# Patient Record
Sex: Male | Born: 1960 | Race: Black or African American | Hispanic: No | Marital: Married | Smoking: Never smoker
Health system: Southern US, Community
[De-identification: ages and names within clinical notes are randomized; demographics above are authoritative.]

## PROBLEM LIST (undated history)

## (undated) DIAGNOSIS — I454 Nonspecific intraventricular block: Secondary | ICD-10-CM

## (undated) DIAGNOSIS — C61 Malignant neoplasm of prostate: Secondary | ICD-10-CM

## (undated) DIAGNOSIS — R7303 Prediabetes: Secondary | ICD-10-CM

## (undated) HISTORY — DX: Malignant neoplasm of prostate: C61

## (undated) HISTORY — DX: Nonspecific intraventricular block: I45.4

## (undated) HISTORY — PX: PROSTATE BIOPSY: SHX241

---
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
## 2000-07-23 ENCOUNTER — Emergency Department (HOSPITAL_COMMUNITY): Admission: EM | Admit: 2000-07-23 | Discharge: 2000-07-23 | Payer: Self-pay | Admitting: Emergency Medicine

## 2000-07-23 ENCOUNTER — Encounter: Payer: Self-pay | Admitting: Emergency Medicine

## 2009-08-08 DIAGNOSIS — R079 Chest pain, unspecified: Secondary | ICD-10-CM | POA: Insufficient documentation

## 2009-08-08 DIAGNOSIS — I454 Nonspecific intraventricular block: Secondary | ICD-10-CM | POA: Insufficient documentation

## 2009-08-08 HISTORY — DX: Nonspecific intraventricular block: I45.4

## 2010-04-17 ENCOUNTER — Emergency Department (HOSPITAL_COMMUNITY)
Admission: EM | Admit: 2010-04-17 | Discharge: 2010-04-17 | Payer: Self-pay | Source: Home / Self Care | Admitting: Emergency Medicine

## 2010-04-25 LAB — OCCULT BLOOD, POC DEVICE: Fecal Occult Bld: NEGATIVE

## 2011-07-03 DIAGNOSIS — I454 Nonspecific intraventricular block: Secondary | ICD-10-CM

## 2011-09-19 DIAGNOSIS — C61 Malignant neoplasm of prostate: Secondary | ICD-10-CM

## 2011-09-19 HISTORY — DX: Malignant neoplasm of prostate: C61

## 2011-11-21 ENCOUNTER — Encounter: Payer: Self-pay | Admitting: *Deleted

## 2011-11-21 NOTE — Progress Notes (Signed)
Married  07/11/11   PSA 2.62 06/23/11 PSA 2.65

## 2011-11-23 ENCOUNTER — Inpatient Hospital Stay: Admission: RE | Admit: 2011-11-23 | Payer: Self-pay | Source: Ambulatory Visit

## 2011-11-23 ENCOUNTER — Encounter: Payer: Self-pay | Admitting: Radiation Oncology

## 2011-11-23 ENCOUNTER — Ambulatory Visit
Admission: RE | Admit: 2011-11-23 | Discharge: 2011-11-23 | Disposition: A | Discharge status: Home / Self Care | Payer: Managed Care, Other (non HMO) | Source: Ambulatory Visit | Attending: Radiation Oncology | Admitting: Radiation Oncology

## 2011-11-23 VITALS — BP 114/74 | HR 63 | Temp 98.8°F | Resp 20 | Ht 69.0 in | Wt 158.5 lb

## 2011-11-23 DIAGNOSIS — C61 Malignant neoplasm of prostate: Secondary | ICD-10-CM | POA: Insufficient documentation

## 2011-11-23 NOTE — Progress Notes (Signed)
Radiation Oncology         [phone]) [phone] ________________________________  Initial outpatient Consultation  Name: Adam Navarro MRN: [id number]  Date: 11/23/2011  DOB: 1961-02-27  CC:  Adam Mc, MD   REFERRING PHYSICIAN: [name], MD  DIAGNOSIS: There were no encounter diagnoses.  HISTORY OF PRESENT ILLNESS::Adam Navarro is a 51 y.o. gentleman.  He was noted to have an elevated PSA of 2.65 by his primary care physician, Dr. Isabella Navarro.  Accordingly, he was referred for evaluation in urology by Dr. Laverle Navarro on 08/23/11,  digital rectal examination was performed at that time revealing a 45 g prostate with no nodules..  The patient proceeded to transrectal ultrasound with 12 biopsies of the prostate on 09/19/2011.  The prostate volume measured 21.7 cc.  Out of 12 core biopsies, one was positive.  The maximum Gleason score was 3+3 equals 6, and this was seen in 5% of the left lateral apex.  The patient reviewed the biopsy results with his urologist and he has kindly been referred today for discussion of potential radiation treatment options. Marland Kitchen  PREVIOUS RADIATION THERAPY: No  PAST MEDICAL HISTORY:  has a past medical history of Chest pain (08/2009); Incomplete bundle branch block (08/2009); and Prostate cancer (09/19/11).    PAST SURGICAL HISTORY: Past Surgical History  Procedure Date  . Scapula 1973    R clavicular fracture    FAMILY HISTORY: family history includes Hypertension in his father and mother.  SOCIAL HISTORY:  reports that he has never smoked. He does not have any smokeless tobacco history on file. He reports that he drinks alcohol. He reports that he does not use illicit drugs.  ALLERGIES: Review of patient's allergies indicates no known allergies.  MEDICATIONS:  Current Outpatient Prescriptions  Medication Sig Dispense Refill  . Vitamin D, Ergocalciferol, (DRISDOL) 50000 UNITS CAPS Take 50,000 Units by mouth.        REVIEW OF SYSTEMS:  A 15 point review of systems  is documented in the electronic medical record. This was obtained by the nursing staff. However, I reviewed this with the patient to discuss relevant findings and make appropriate changes.  A comprehensive review of systems was negative. patient to let urinary symptom questionnaire indicating no outflow obstructive symptoms whatsoever. He also: Erectile function questionnaire indicating no erectile dysfunction   PHYSICAL EXAM:  height is 5\' 9"  (1.753 m) and weight is 158 lb 8 oz (71.895 kg). His oral temperature is 98.8 F (37.1 C). His blood pressure is 114/74 and his pulse is 63. His respiration is 20.   Adam Navarro is in no acute distress today. Is alert and oriented. Detailed physical exams deferred in the interest of patient counseling. Please note the digital rectal exam findings described by Dr. Laverle Navarro with no nodules within the prostate.    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 He falls into a favorable risk group. He is eligible for a variety of potential treatment strategies including active surveillance, radical prostatectomy, and radiation treatment either with external radiation or seed implantation.  PLAN:Today I reviewed the findings and workup thus far.  We discussed the natural history of prostate cancer.  We reviewed the the implications of T-stage, Gleason's Score, and PSA on decision-making and outcomes in prostate cancer.  We discussed radiation treatment in the management of prostate cancer with regard to the logistics and delivery of external beam radiation treatment as well as  the logistics and delivery of prostate brachytherapy.  We compared and contrasted each of these approaches and also compared these against prostatectomy.  The patient expressed interest in robotic-assisted laparoscopic radical prostatectomy. However, he is also interested in the potential for active surveillance to  delay surgery and gather additional prognostic information about the disease tempo of progression or PSA velocity. At this point, he is trying to decide whether to delay surgery or simply proceed with prostatectomy now. I tried to provide some insight into the pros and cons of each of these.  I will share my findings with Dr. Laverle Navarro.   The patient is planning to contact Dr. Laverle Navarro following today's visit to arrange a followup discussion.  I enjoyed meeting with him today, and will look forward to participating in the care of this very nice gentleman.   I spent 40 minutes minutes face to face with the patient and more than 50% of that time was spent in counseling and/or coordination of care.   ------------------------------------------------  Adam Navarro. Kathrynn Running, M.D.

## 2011-11-23 NOTE — Progress Notes (Signed)
Please see the Nurse Progress Note in the MD Initial Consult Encounter for this patient. 

## 2011-11-24 NOTE — Addendum Note (Signed)
Encounter addended by: Glennie Hawk, RN on: 11/24/2011 12:57 PM<BR>     Documentation filed: Charges VN

## 2012-01-19 ENCOUNTER — Other Ambulatory Visit (HOSPITAL_COMMUNITY): Payer: Self-pay | Admitting: Urology

## 2012-01-19 DIAGNOSIS — C61 Malignant neoplasm of prostate: Secondary | ICD-10-CM

## 2012-03-22 ENCOUNTER — Ambulatory Visit (HOSPITAL_COMMUNITY)
Admission: RE | Admit: 2012-03-22 | Discharge: 2012-03-22 | Disposition: A | Discharge status: Home / Self Care | Payer: Managed Care, Other (non HMO) | Source: Ambulatory Visit | Attending: Urology | Admitting: Urology

## 2012-03-22 DIAGNOSIS — C7951 Secondary malignant neoplasm of bone: Secondary | ICD-10-CM | POA: Insufficient documentation

## 2012-03-22 DIAGNOSIS — C61 Malignant neoplasm of prostate: Secondary | ICD-10-CM | POA: Insufficient documentation

## 2012-03-22 DIAGNOSIS — R599 Enlarged lymph nodes, unspecified: Secondary | ICD-10-CM | POA: Insufficient documentation

## 2012-03-22 LAB — POCT I-STAT, CHEM 8
BUN: 14 mg/dL (ref 6–23)
Calcium, Ion: 1.29 mmol/L — ABNORMAL HIGH (ref 1.12–1.23)
Chloride: 102 mEq/L (ref 96–112)
Creatinine, Ser: 1.1 mg/dL (ref 0.50–1.35)
Glucose, Bld: 97 mg/dL (ref 70–99)
HCT: 48 % (ref 39.0–52.0)
Hemoglobin: 16.3 g/dL (ref 13.0–17.0)
Potassium: 4.8 mEq/L (ref 3.5–5.1)
Sodium: 140 mEq/L (ref 135–145)
TCO2: 32 mmol/L (ref 0–100)

## 2012-03-22 MED ORDER — GADOBENATE DIMEGLUMINE 529 MG/ML IV SOLN
15.0000 mL | Freq: Once | INTRAVENOUS | Status: AC | PRN
  Administered 2012-03-22: 14 mL via INTRAVENOUS

## 2012-10-23 ENCOUNTER — Encounter: Payer: Self-pay | Admitting: Internal Medicine

## 2012-12-31 ENCOUNTER — Ambulatory Visit (AMBULATORY_SURGERY_CENTER): Payer: Self-pay | Admitting: *Deleted

## 2012-12-31 VITALS — Ht 70.0 in | Wt 159.4 lb

## 2012-12-31 DIAGNOSIS — Z1211 Encounter for screening for malignant neoplasm of colon: Secondary | ICD-10-CM

## 2012-12-31 MED ORDER — NA SULFATE-K SULFATE-MG SULF 17.5-3.13-1.6 GM/177ML PO SOLN: ORAL | Status: DC

## 2012-12-31 NOTE — Progress Notes (Signed)
No egg or soy allergy 

## 2013-01-03 ENCOUNTER — Encounter: Payer: Self-pay | Admitting: Internal Medicine

## 2013-01-10 ENCOUNTER — Encounter: Payer: Self-pay | Admitting: Internal Medicine

## 2013-01-10 ENCOUNTER — Ambulatory Visit (AMBULATORY_SURGERY_CENTER): Payer: Managed Care, Other (non HMO) | Admitting: Internal Medicine

## 2013-01-10 VITALS — BP 107/54 | HR 65 | Temp 97.0°F | Resp 12 | Ht 70.0 in | Wt 159.0 lb

## 2013-01-10 DIAGNOSIS — Z1211 Encounter for screening for malignant neoplasm of colon: Secondary | ICD-10-CM

## 2013-01-10 MED ORDER — SODIUM CHLORIDE 0.9 % IV SOLN: 500.0000 mL | INTRAVENOUS | Status: DC

## 2013-01-10 NOTE — Op Note (Signed)
[facility] [address] [place], [address]   COLONOSCOPY PROCEDURE REPORT  PATIENT: Adam, Navarro  MR#: [id number] BIRTHDATE: 12/08/1960 , 52  yrs. old GENDER: Male ENDOSCOPIST: Iva Boop, MD, [facility] REFERRED NG:EXBMW Richter, M.D. PROCEDURE DATE:  01/10/2013 PROCEDURE:   Colonoscopy, screening First Screening Colonoscopy - Avg.  risk and is 50 yrs.  old or older Yes.  Prior Negative Screening - Now for repeat screening. N/A  History of Adenoma - Now for follow-up colonoscopy & has been > or = to 3 yrs.  N/A  Polyps Removed Today? No.  Recommend repeat exam, <10 yrs? No. ASA CLASS:   Class II INDICATIONS:average risk screening and first colonoscopy. MEDICATIONS: Propofol (Diprivan) 280 mg IV, MAC sedation, administered by CRNA, and These medications were titrated to patient response per physician's verbal order  DESCRIPTION OF PROCEDURE:   After the risks benefits and alternatives of the procedure were thoroughly explained, informed consent was obtained.  A digital rectal exam revealed no rectal mass.   The LB [id number]  endoscope was introduced through the anus and advanced to the cecum, which was identified by both the appendix and ileocecal valve. No adverse events experienced. The quality of the prep was excellent using Suprep  The instrument was then slowly withdrawn as the colon was fully examined.      COLON FINDINGS: A normal appearing cecum, ileocecal valve, and appendiceal orifice were identified.  The ascending, hepatic flexure, transverse, splenic flexure, descending, sigmoid colon and rectum appeared unremarkable.  No polyps or cancers were seen.   A right colon retroflexion was performed.  Retroflexed views revealed internal hemorrhoids. The time to cecum=3 minutes 05 seconds. Withdrawal time=8 minutes 03 seconds.  The scope was withdrawn and the procedure completed. COMPLICATIONS: There were no complications.  ENDOSCOPIC  IMPRESSION: 1.   Normal colon - excellent prep 2.   Internal hemorrhoids in rectum  RECOMMENDATIONS: Repeat routine colonscopy in 10 years - 2024. Annual hemoccults not indicated in interim.   eSigned:  Iva Boop, MD, [facility] 01/10/2013 9:56 AM   cc: The Patient and Nadyne Coombes, MD

## 2013-01-10 NOTE — Progress Notes (Signed)
Report to pacu rn, vss, bbs=clear 

## 2013-01-10 NOTE — Progress Notes (Signed)
Patient did not have preoperative order for IV antibiotic SSI prophylaxis. (G8918)  Patient did not experience any of the following events: a burn prior to discharge; a fall within the facility; wrong site/side/patient/procedure/implant event; or a hospital transfer or hospital admission upon discharge from the facility. (G8907)  

## 2013-01-10 NOTE — Patient Instructions (Addendum)
You have some hemorrhoids but everything else is ok. No polyps or cancer.  Next routine colonoscopy in 10 years - 2024.  If you have hemorrhoid problems (swelling, itching, bleeding) I am able to treat those with an in-office procedure. If you like, please call my office at [phone] to schedule an appointment and I can evaluate you further.  I appreciate the opportunity to care for you. Iva Boop, MD, FACG  YOU HAD AN ENDOSCOPIC PROCEDURE TODAY AT THE [facility]: Refer to the procedure report that was given to you for any specific questions about what was found during the examination.  If the procedure report does not answer your questions, please call your gastroenterologist to clarify.  If you requested that your care partner not be given the details of your procedure findings, then the procedure report has been included in a sealed envelope for you to review at your convenience later.  YOU SHOULD EXPECT: Some feelings of bloating in the abdomen. Passage of more gas than usual.  Walking can help get rid of the air that was put into your GI tract during the procedure and reduce the bloating. If you had a lower endoscopy (such as a colonoscopy or flexible sigmoidoscopy) you may notice spotting of blood in your stool or on the toilet paper. If you underwent a bowel prep for your procedure, then you may not have a normal bowel movement for a few days.  DIET: Your first meal following the procedure should be a light meal and then it is ok to progress to your normal diet.  A half-sandwich or bowl of soup is an example of a good first meal.  Heavy or fried foods are harder to digest and may make you feel nauseous or bloated.  Likewise meals heavy in dairy and vegetables can cause extra gas to form and this can also increase the bloating.  Drink plenty of fluids but you should avoid alcoholic beverages for 24 hours.  ACTIVITY: Your care partner should take you home directly after the  procedure.  You should plan to take it easy, moving slowly for the rest of the day.  You can resume normal activity the day after the procedure however you should NOT DRIVE or use heavy machinery for 24 hours (because of the sedation medicines used during the test).    SYMPTOMS TO REPORT IMMEDIATELY: A gastroenterologist can be reached at any hour.  During normal business hours, 8:30 AM to 5:00 PM Monday through Friday, call [phone].  After hours and on weekends, please call the GI answering service at [phone] who will take a message and have the physician on call contact you.   Following lower endoscopy (colonoscopy or flexible sigmoidoscopy):  Excessive amounts of blood in the stool  Significant tenderness or worsening of abdominal pains  Swelling of the abdomen that is new, acute  Fever of 100F or higher  FOLLOW UP: If any biopsies were taken you will be contacted by phone or by letter within the next 1-3 weeks.  Call your gastroenterologist if you have not heard about the biopsies in 3 weeks.  Our staff will call the home number listed on your records the next business day following your procedure to check on you and address any questions or concerns that you may have at that time regarding the information given to you following your procedure. This is a courtesy call and so if there is no answer at the home number and we  have not heard from you through the emergency physician on call, we will assume that you have returned to your regular daily activities without incident.  SIGNATURES/CONFIDENTIALITY: You and/or your care partner have signed paperwork which will be entered into your electronic medical record.  These signatures attest to the fact that that the information above on your After Visit Summary has been reviewed and is understood.  Full responsibility of the confidentiality of this discharge information lies with you and/or your care-partner.

## 2013-01-13 ENCOUNTER — Telehealth: Payer: Self-pay | Admitting: *Deleted

## 2013-01-13 NOTE — Telephone Encounter (Signed)
  Follow up Call-  Call back number 01/10/2013  Post procedure Call Back phone  # [phone]- no message/ [phone]- leave message here  Permission to leave phone message No     Patient questions:  Do you have a fever, pain , or abdominal swelling? no Pain Score  0 *  Have you tolerated food without any problems? yes  Have you been able to return to your normal activities? yes  Do you have any questions about your discharge instructions: Diet   no Medications  no Follow up visit  no  Do you have questions or concerns about your Care? no  Actions: * If pain score is 4 or above: No action needed, pain <4.

## 2013-05-01 ENCOUNTER — Encounter (HOSPITAL_BASED_OUTPATIENT_CLINIC_OR_DEPARTMENT_OTHER): Payer: Self-pay | Admitting: Emergency Medicine

## 2013-05-01 ENCOUNTER — Emergency Department (HOSPITAL_BASED_OUTPATIENT_CLINIC_OR_DEPARTMENT_OTHER)
Admission: EM | Admit: 2013-05-01 | Discharge: 2013-05-01 | Disposition: A | Discharge status: Home / Self Care | Payer: Managed Care, Other (non HMO) | Attending: Emergency Medicine | Admitting: Emergency Medicine

## 2013-05-01 ENCOUNTER — Emergency Department (HOSPITAL_BASED_OUTPATIENT_CLINIC_OR_DEPARTMENT_OTHER): Payer: Managed Care, Other (non HMO)

## 2013-05-01 DIAGNOSIS — Z8546 Personal history of malignant neoplasm of prostate: Secondary | ICD-10-CM | POA: Insufficient documentation

## 2013-05-01 DIAGNOSIS — R0789 Other chest pain: Secondary | ICD-10-CM | POA: Insufficient documentation

## 2013-05-01 DIAGNOSIS — J111 Influenza due to unidentified influenza virus with other respiratory manifestations: Secondary | ICD-10-CM | POA: Insufficient documentation

## 2013-05-01 DIAGNOSIS — Z8679 Personal history of other diseases of the circulatory system: Secondary | ICD-10-CM | POA: Insufficient documentation

## 2013-05-01 DIAGNOSIS — R000 Tachycardia, unspecified: Secondary | ICD-10-CM | POA: Insufficient documentation

## 2013-05-01 MED ORDER — HYDROCODONE-HOMATROPINE 5-1.5 MG/5ML PO SYRP: 5.0000 mL | ORAL_SOLUTION | Freq: Four times a day (QID) | ORAL | Status: DC | PRN

## 2013-05-01 NOTE — ED Notes (Signed)
Fever, SOB, body aches, cough, joint aches x4 days.

## 2013-05-01 NOTE — ED Provider Notes (Signed)
CSN: [id number]     Arrival date & time 05/01/13  1704 History  This chart was scribed for Blanchie Dessert, MD by Zettie Pho, ED Scribe. This patient was seen in room MH06/MH06 and the patient's care was started at [place] PM.    Chief Complaint  Patient presents with  . Shortness of Breath   The history is provided by the patient. No language interpreter was used.   HPI Comments: Adam Navarro is a 53 y.o. male who presents to the Emergency Department complaining of fever (101.4 measured in the ED) with associated dry cough, mild sore throat, rhinorrhea, diffuse myalgias and arthralgias onset 4-5 days ago. He reports some associated shortness of breath and chest pain that is exacerbated with coughing. He denies emesis, diarrhea, wheezes. He states that he did not receive a flu vaccination this year. Patient denies any known sick contacts, but does work at [employer]. He denies any allergies to medications. He denies history of DM or asthma. Patient has a history of incomplete bundle branch block and prostate cancer.   Past Medical History  Diagnosis Date  . Chest pain 08/2009  . Incomplete bundle branch block 08/2009  . Prostate cancer 09/19/11    Gleason 3+3=6, volume21.7 cc   Past Surgical History  Procedure Laterality Date  . Scapula  1973    R clavicular fracture   Family History  Problem Relation Age of Onset  . Hypertension Mother   . Hypertension Father   . Colon cancer Neg Hx   . Esophageal cancer Neg Hx   . Stomach cancer Neg Hx   . Rectal cancer Neg Hx    History  Substance Use Topics  . Smoking status: Never Smoker   . Smokeless tobacco: Never Used  . Alcohol Use: 0 - .5 oz/week    0-1 drink(s) per week     Comment: Pt states he drinks occasionally (beer and vodka)    Review of Systems  A complete 10 system review of systems was obtained and all systems are negative except as noted in the HPI and PMH.   Allergies  Review of patient's allergies indicates no known  allergies.  Home Medications   Current Outpatient Rx  Name  Route  Sig  Dispense  Refill  . Vitamin D, Ergocalciferol, (DRISDOL) 50000 UNITS CAPS   Oral   Take 50,000 Units by mouth.          Triage Vitals: BP 133/88  Pulse 108  Temp(Src) 101.4 F (38.6 C) (Oral)  Resp 18  Ht 5\' 9"  (1.753 m)  Wt 160 lb (72.576 kg)  BMI 23.62 kg/m2  SpO2 100%  Physical Exam  Nursing note and vitals reviewed. Constitutional: He is oriented to person, place, and time. He appears well-developed and well-nourished. No distress.  HENT:  Head: Normocephalic and atraumatic.  Right Ear: Hearing, tympanic membrane, external ear and ear canal normal.  Left Ear: Hearing, tympanic membrane, external ear and ear canal normal.  Mouth/Throat: No oropharyngeal exudate.  Mild pharyngeal erythema.   Eyes: Conjunctivae are normal.  Neck: Normal range of motion. Neck supple.  Cardiovascular: Regular rhythm and normal heart sounds.   Tachycardic.   Pulmonary/Chest: Effort normal and breath sounds normal. No respiratory distress.  Abdominal: Soft. He exhibits no distension. There is no tenderness.  Musculoskeletal: Normal range of motion.  Lymphadenopathy:    He has no cervical adenopathy.  Neurological: He is alert and oriented to person, place, and time.  Skin: Skin is warm  and dry.  Skin is hot to the touch.   Psychiatric: He has a normal mood and affect. His behavior is normal.    ED Course  Procedures (including critical care time)  DIAGNOSTIC STUDIES: Oxygen Saturation is 100% on room air, normal by my interpretation.    COORDINATION OF CARE: 6:20 PM- Discussed that chest x-ray results were negative and that symptoms are likely due to influenza. Discussed that Tamiflu will not be effective at this stage of the illness. Will discharge patient with Hycodan to manage symptoms. Advised patient to stay hydrated and alternate Tylenol and ibuprofen at home. Discussed treatment plan with patient at  bedside and patient verbalized agreement.    Labs Review Labs Reviewed - No data to display  Imaging Review Dg Chest 2 View  05/01/2013   CLINICAL DATA:  Cough and fever.  History of prostate cancer.  EXAM: CHEST  2 VIEW  COMPARISON:  None.  FINDINGS: Mild pectus excavatum deformity. Surgical changes of the distal right clavicle. Midline trachea. Normal heart size and mediastinal contours. No pleural effusion or pneumothorax. Clear lungs.  IMPRESSION: No acute cardiopulmonary disease.   Electronically Signed   By: Abigail Miyamoto M.D.   On: 05/01/2013 18:15    EKG Interpretation   None       MDM   1. Influenza     Pt with symptoms consistent with influenza.  Normal exam here but is febrile.  No signs of breathing difficulty  No signs of strep pharyngitis, otitis or abnormal abdominal findings.   CXR wnl.  Will continue antipyretica and rest and fluids and return for any further problems.   I personally performed the services described in this documentation, which was scribed in my presence.  The recorded information has been reviewed and considered.     Blanchie Dessert, MD 05/01/13 [phone]

## 2016-01-07 ENCOUNTER — Encounter: Payer: Self-pay | Admitting: Medical Oncology

## 2016-01-11 ENCOUNTER — Telehealth: Payer: Self-pay | Admitting: Medical Oncology

## 2016-01-11 NOTE — Telephone Encounter (Signed)
Left message requesting a return call regarding referral to Prostate [facility]. 

## 2016-01-18 ENCOUNTER — Encounter: Payer: Self-pay | Admitting: Medical Oncology

## 2016-01-20 ENCOUNTER — Encounter: Payer: Self-pay | Admitting: Radiation Oncology

## 2016-01-20 ENCOUNTER — Telehealth: Payer: Self-pay | Admitting: Medical Oncology

## 2016-01-20 NOTE — Progress Notes (Signed)
I called pt to introduce myself as the Prostate Nurse Navigator and the Coordinator of the Prostate [place].  1. I confirmed with the patient he is aware of his referral to the clinic 01/21/16 arriving at 7:45am.  2. I discussed the format of the clinic and the physicians he will be seeing that day.  3. I discussed where the clinic is located and how to contact me.  4. I confirmed his address and informed him I would be mailing a packet of information and forms to be completed. I asked him to bring them with him the day of his appointment.   He voiced understanding of the above. I asked him to call me if he has any questions or concerns regarding his appointments or the forms he needs to complete.

## 2016-01-20 NOTE — Telephone Encounter (Signed)
Left a reminder message  For Prostate Adam Navarro - [facility] 01/21/16 arriving at 7:45am. I reminded him to bring his completed medical forms and how to register. I asked him to call me back with questions or concerns.

## 2016-01-20 NOTE — Progress Notes (Signed)
GU Location of Tumor / Histology: prostatic adenocarcinoma  If Prostate Cancer, Gleason Score is (3 + 4) and PSA is (2.59) January 2017  [place] was found to have an elevated PSA of 2.6 in 2013. Biopsy confirmed Gleason 3+3. Patient met with Dr. Tammi Klippel but, opted for active surveillance. Repeat biopsy done August 2017 revealed progression. Patient has expressed that he doesn't want surgery but, instead leaning toward radiation therapy.  Biopsies of prostate (if applicable) revealed:    Past/Anticipated interventions by urology, if any: active surveillance  Past/Anticipated interventions by medical oncology, if any: no  Weight changes, if any: no  Bowel/Bladder complaints, if any: no   Nausea/Vomiting, if any: no  Pain issues, if any:  no  SAFETY ISSUES:  Prior radiation? no  Pacemaker/ICD? no  Possible current pregnancy? no  Is the patient on methotrexate? no  Current Complaints / other details:  55 year old male. Married.

## 2016-01-21 ENCOUNTER — Ambulatory Visit
Admission: RE | Admit: 2016-01-21 | Discharge: 2016-01-21 | Disposition: A | Discharge status: Home / Self Care | Payer: Managed Care, Other (non HMO) | Source: Ambulatory Visit | Attending: Radiation Oncology | Admitting: Radiation Oncology

## 2016-01-21 ENCOUNTER — Ambulatory Visit: Payer: Managed Care, Other (non HMO) | Admitting: Oncology

## 2016-01-25 ENCOUNTER — Telehealth: Payer: Self-pay | Admitting: Medical Oncology

## 2016-01-25 NOTE — Telephone Encounter (Signed)
Left message requesting a return call to discuss missed appointment for Prostate [facility]  01/21/16

## 2016-01-26 ENCOUNTER — Telehealth: Payer: Self-pay | Admitting: Medical Oncology

## 2016-01-26 NOTE — Telephone Encounter (Signed)
Left message requesting return call regarding missed appointment for Prostate [facility].

## 2016-01-28 ENCOUNTER — Encounter: Payer: Self-pay | Admitting: Medical Oncology

## 2016-01-28 NOTE — Progress Notes (Signed)
I notified [facility] that I have left several messages with Adam Navarro regarding his missed Prostate [place] appointment without a return call. Bethena Roys- medical records sent me a note that she received a request from [place] for records for a second opinion.

## 2017-03-06 ENCOUNTER — Other Ambulatory Visit: Payer: Self-pay | Admitting: Urology

## 2017-05-08 NOTE — Patient Instructions (Signed)
Adam Navarro  05/08/2017   Your procedure is scheduled on: 05-14-17    Report to [facility] Main  Entrance   Follow signs to Short Stay on first floor at 530 AM    Call this number if you have problems the morning of surgery [phone]     Remember: Do not eat food or drink liquids :After Midnight.     Take these medicines the morning of surgery with A SIP OF WATER: NONE                                You may not have any metal on your body including hair pins and              piercings  Do not wear jewelry, make-up, lotions, powders or perfumes, deodorant              Men may shave face and neck.   Do not bring valuables to the hospital. [place].  Contacts, dentures or bridgework may not be worn into surgery.  Leave suitcase in the car. After surgery it may be brought to your room.                 Please read over the following fact sheets you were given: _____________________________________________________________________             [facility] - Preparing for Surgery Before surgery, you can play an important role.  Because skin is not sterile, your skin needs to be as free of germs as possible.  You can reduce the number of germs on your skin by washing with CHG (chlorahexidine gluconate) soap before surgery.  CHG is an antiseptic cleaner which kills germs and bonds with the skin to continue killing germs even after washing. Please DO NOT use if you have an allergy to CHG or antibacterial soaps.  If your skin becomes reddened/irritated stop using the CHG and inform your nurse when you arrive at Short Stay. Do not shave (including legs and underarms) for at least 48 hours prior to the first CHG shower.  You may shave your face/neck. Please follow these instructions carefully:  1.  Shower with CHG Soap the night before surgery and the  morning of Surgery.  2.  If you choose to wash your hair,  wash your hair first as usual with your  normal  shampoo.  3.  After you shampoo, rinse your hair and body thoroughly to remove the  shampoo.                           4.  Use CHG as you would any other liquid soap.  You can apply chg directly  to the skin and wash                       Gently with a scrungie or clean washcloth.  5.  Apply the CHG Soap to your body ONLY FROM THE NECK DOWN.   Do not use on face/ open                           Wound  or open sores. Avoid contact with eyes, ears mouth and genitals (private parts).                       Wash face,  Genitals (private parts) with your normal soap.             6.  Wash thoroughly, paying special attention to the area where your surgery  will be performed.  7.  Thoroughly rinse your body with warm water from the neck down.  8.  DO NOT shower/wash with your normal soap after using and rinsing off  the CHG Soap.                9.  Pat yourself dry with a clean towel.            10.  Wear clean pajamas.            11.  Place clean sheets on your bed the night of your first shower and do not  sleep with pets. Day of Surgery : Do not apply any lotions/deodorants the morning of surgery.  Please wear clean clothes to the hospital/surgery center.  FAILURE TO FOLLOW THESE INSTRUCTIONS MAY RESULT IN THE CANCELLATION OF YOUR SURGERY PATIENT SIGNATURE_________________________________  NURSE SIGNATURE__________________________________  ________________________________________________________________________   Adam Phenix  An incentive spirometer is a tool that can help keep your lungs clear and active. This tool measures how well you are filling your lungs with each breath. Taking long deep breaths may help reverse or decrease the chance of developing breathing (pulmonary) problems (especially infection) following:  A long period of time when you are unable to move or be active. BEFORE THE PROCEDURE   If the spirometer includes an  indicator to show your best effort, your nurse or respiratory therapist will set it to a desired goal.  If possible, sit up straight or lean slightly forward. Try not to slouch.  Hold the incentive spirometer in an upright position. INSTRUCTIONS FOR USE  1. Sit on the edge of your bed if possible, or sit up as far as you can in bed or on a chair. 2. Hold the incentive spirometer in an upright position. 3. Breathe out normally. 4. Place the mouthpiece in your mouth and seal your lips tightly around it. 5. Breathe in slowly and as deeply as possible, raising the piston or the ball toward the top of the column. 6. Hold your breath for 3-5 seconds or for as long as possible. Allow the piston or ball to fall to the bottom of the column. 7. Remove the mouthpiece from your mouth and breathe out normally. 8. Rest for a few seconds and repeat Steps 1 through 7 at least 10 times every 1-2 hours when you are awake. Take your time and take a few normal breaths between deep breaths. 9. The spirometer may include an indicator to show your best effort. Use the indicator as a goal to work toward during each repetition. 10. After each set of 10 deep breaths, practice coughing to be sure your lungs are clear. If you have an incision (the cut made at the time of surgery), support your incision when coughing by placing a pillow or rolled up towels firmly against it. Once you are able to get out of bed, walk around indoors and cough well. You may stop using the incentive spirometer when instructed by your caregiver.  RISKS AND COMPLICATIONS  Take your time so you do not get dizzy  or light-headed.  If you are in pain, you may need to take or ask for pain medication before doing incentive spirometry. It is harder to take a deep breath if you are having pain. AFTER USE  Rest and breathe slowly and easily.  It can be helpful to keep track of a log of your progress. Your caregiver can provide you with a simple table  to help with this. If you are using the spirometer at home, follow these instructions: [place] IF:   You are having difficultly using the spirometer.  You have trouble using the spirometer as often as instructed.  Your pain medication is not giving enough relief while using the spirometer.  You develop fever of 100.5 F (38.1 C) or higher. SEEK IMMEDIATE MEDICAL CARE IF:   You cough up bloody sputum that had not been present before.  You develop fever of 102 F (38.9 C) or greater.  You develop worsening pain at or near the incision site. MAKE SURE YOU:   Understand these instructions.  Will watch your condition.  Will get help right away if you are not doing well or get worse. Document Released: 08/07/2006 Document Revised: 06/19/2011 Document Reviewed: 10/08/2006 ExitCare Patient Information 2014 ExitCare, [place].   ________________________________________________________________________  WHAT IS A BLOOD TRANSFUSION? Blood Transfusion Information  A transfusion is the replacement of blood or some of its parts. Blood is made up of multiple cells which provide different functions.  Red blood cells carry oxygen and are used for blood loss replacement.  White blood cells fight against infection.  Platelets control bleeding.  Plasma helps clot blood.  Other blood products are available for specialized needs, such as hemophilia or other clotting disorders. BEFORE THE TRANSFUSION  Who gives blood for transfusions?   Healthy volunteers who are fully evaluated to make sure their blood is safe. This is blood bank blood. Transfusion therapy is the safest it has ever been in the practice of medicine. Before blood is taken from a donor, a complete history is taken to make sure that person has no history of diseases nor engages in risky social behavior (examples are intravenous drug use or sexual activity with multiple partners). The donor's travel history is screened  to minimize risk of transmitting infections, such as malaria. The donated blood is tested for signs of infectious diseases, such as HIV and hepatitis. The blood is then tested to be sure it is compatible with you in order to minimize the chance of a transfusion reaction. If you or a relative donates blood, this is often done in anticipation of surgery and is not appropriate for emergency situations. It takes many days to process the donated blood. RISKS AND COMPLICATIONS Although transfusion therapy is very safe and saves many lives, the main dangers of transfusion include:   Getting an infectious disease.  Developing a transfusion reaction. This is an allergic reaction to something in the blood you were given. Every precaution is taken to prevent this. The decision to have a blood transfusion has been considered carefully by your caregiver before blood is given. Blood is not given unless the benefits outweigh the risks. AFTER THE TRANSFUSION  Right after receiving a blood transfusion, you will usually feel much better and more energetic. This is especially true if your red blood cells have gotten low (anemic). The transfusion raises the level of the red blood cells which carry oxygen, and this usually causes an energy increase.  The nurse administering the transfusion will monitor  you carefully for complications. HOME CARE INSTRUCTIONS  No special instructions are needed after a transfusion. You may find your energy is better. Speak with your caregiver about any limitations on activity for underlying diseases you may have. SEEK MEDICAL CARE IF:   Your condition is not improving after your transfusion.  You develop redness or irritation at the intravenous (IV) site. SEEK IMMEDIATE MEDICAL CARE IF:  Any of the following symptoms occur over the next 12 hours:  Shaking chills.  You have a temperature by mouth above 102 F (38.9 C), not controlled by medicine.  Chest, back, or muscle  pain.  People around you feel you are not acting correctly or are confused.  Shortness of breath or difficulty breathing.  Dizziness and fainting.  You get a rash or develop hives.  You have a decrease in urine output.  Your urine turns a dark color or changes to pink, red, or brown. Any of the following symptoms occur over the next 10 days:  You have a temperature by mouth above 102 F (38.9 C), not controlled by medicine.  Shortness of breath.  Weakness after normal activity.  The white part of the eye turns yellow (jaundice).  You have a decrease in the amount of urine or are urinating less often.  Your urine turns a dark color or changes to pink, red, or brown. Document Released: 03/24/2000 Document Revised: 06/19/2011 Document Reviewed: 11/11/2007 [facility] Patient Information 2014 [place], [place].  _______________________________________________________________________

## 2017-05-09 ENCOUNTER — Encounter (HOSPITAL_COMMUNITY): Payer: Self-pay

## 2017-05-09 ENCOUNTER — Encounter (INDEPENDENT_AMBULATORY_CARE_PROVIDER_SITE_OTHER): Payer: Self-pay

## 2017-05-09 ENCOUNTER — Other Ambulatory Visit: Payer: Self-pay

## 2017-05-09 ENCOUNTER — Encounter (HOSPITAL_COMMUNITY)
Admission: RE | Admit: 2017-05-09 | Discharge: 2017-05-09 | Disposition: A | Discharge status: Home / Self Care | Payer: Managed Care, Other (non HMO) | Source: Ambulatory Visit | Attending: Urology | Admitting: Urology

## 2017-05-09 DIAGNOSIS — Z01812 Encounter for preprocedural laboratory examination: Secondary | ICD-10-CM | POA: Insufficient documentation

## 2017-05-09 DIAGNOSIS — I10 Essential (primary) hypertension: Secondary | ICD-10-CM | POA: Insufficient documentation

## 2017-05-09 DIAGNOSIS — Z01810 Encounter for preprocedural cardiovascular examination: Secondary | ICD-10-CM | POA: Insufficient documentation

## 2017-05-09 DIAGNOSIS — I454 Nonspecific intraventricular block: Secondary | ICD-10-CM | POA: Diagnosis not present

## 2017-05-09 HISTORY — DX: Prediabetes: R73.03

## 2017-05-09 LAB — BASIC METABOLIC PANEL
Anion gap: 5 (ref 5–15)
BUN: 16 mg/dL (ref 6–20)
CO2: 29 mmol/L (ref 22–32)
Calcium: 9.6 mg/dL (ref 8.9–10.3)
Chloride: 105 mmol/L (ref 101–111)
Creatinine, Ser: 1 mg/dL (ref 0.61–1.24)
GFR calc Af Amer: >60 mL/min (ref >60)
GLUCOSE: 101 mg/dL — AB (ref 65–99)
POTASSIUM: 5.3 mmol/L — AB (ref 3.5–5.1)
Sodium: 139 mmol/L (ref 135–145)

## 2017-05-09 LAB — CBC
HEMATOCRIT: 41.5 % (ref 39.0–52.0)
Hemoglobin: 13.6 g/dL (ref 13.0–17.0)
MCH: 27.3 pg (ref 26.0–34.0)
MCHC: 32.8 g/dL (ref 30.0–36.0)
MCV: 83.2 fL (ref 78.0–100.0)
Platelets: 295 10*3/uL (ref 150–400)
RBC: 4.99 MIL/uL (ref 4.22–5.81)
RDW: 14.5 % (ref 11.5–15.5)
WBC: 4.5 10*3/uL (ref 4.0–10.5)

## 2017-05-09 LAB — HEMOGLOBIN A1C
Hgb A1c MFr Bld: 6.1 % — ABNORMAL HIGH (ref 4.8–5.6)
MEAN PLASMA GLUCOSE: 128.37 mg/dL

## 2017-05-09 LAB — ABO/RH: ABO/RH(D): O NEG

## 2017-05-11 NOTE — H&P (Signed)
Office Visit Report     04/27/2017   --------------------------------------------------------------------------------   Adam Navarro  MRN: [id number]  PRIMARY CARE:  Adam Landsman, MD  DOB: 09-26-1960, 57 year old Male  REFERRING:    SSN:   PROVIDER:  Raynelle Navarro, M.D.    SUPERVISING:  Adam Navarro, M.D.    TREATING:  Adam Navarro    LOCATION:  [facility] [phone]   --------------------------------------------------------------------------------   CC/HPI: Per OV with Dr. 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 At his last visit, we discussed his options further and he has made a treatment decision to proceed with surgical treatment. He follows up today for further discussion in preparation for surgery which is scheduled for late January. His history is outlined below.   Prostate cancer: He was found to have a PSA of 2.6 prompting a prostate biopsy on 09/19/11 which confirmed Gleason 3+3=6 adenocarcinoma of the prostate in 1 out of 12 biopsy cores. He has no family history of prostate cancer. He is very healthy and has no significant medical comorbid conditions. After discussing management options in detail with me and Dr. Tammi Navarro, he elected to proceed with active surveillance with delayed treatment of curative intent in the future. He places a high priority on his quality of life and wishes to delay definitive treatment as long as possible. He has a clear understanding of the risk of cancer progression while undergoing surveillance and has accepted that risk.   He was followed on active surveillance from June 2013 through August 2017 when he was noted to have upgraded Gleason 3+4=7 adenocarcinoma on surveillance biopsy at age 34. He was scheduled to follow up in the multidisciplinary clinic in the fall of  2017 but did not show for that appointment and apparently sought a 2nd opinion at [facility]. He again established care with me in October 2018 after receiving care and further evaluation at [facility]. He had a repeat MRI of the prostate demonstrating stability of his findings on his prior MRI. He also had an MR/US fusion biopsy that indicated Gleason 3+3=6 disease. After reviewing his options, he ultimately wished to come back to [facility] and proceed with definitive curative therapy.   Initial diagnosis: June 2013  TNM stage: cT1c Nx Mx  PSA: 2.6  Gleason score: 3+3=6  Biopsy (09/19/11): 1 /12 cores positive -- L lateral apex (5%)  Prostate volume: 21.7cc   Surveillance  Dec 2013: MRI - primary focus in right lateral mid gland and smaller focus in left lateral mid gland  Dec 2013: 26 core biopsy -- 3/26 cores positive - L lateral apex (5%, 3+3=6), R mid (2/2 cores, 10%, 10%, 3+3=6), Vol 26.9 cc, hypoechoic lesion at left apex  May 2015: 12 core biopsy - R mid (10%, 3+3=6), Vol 24.2 cc  Aug 2017: 12 core biopsy - 1/12 cores positive - R mid (10%, 3+4=7)   Baseline urinary function: He denies LUTS. IPSS is 1.  Baseline erectile function: He denies erectile dysfunction. SHIM score is 25.   118/19: He returns today for preoperative appointment. He is scheduled for Robotic Radical prostatectomy on 2/4. He denies any current dysuria, gross hematuria, or recent fevers. He denies any significant changes in his overall health since being seen last. He is not on any blood thinners.     ALLERGIES: No Allergies  MEDICATIONS: None   GU PSH: Prostate Needle Biopsy - 11/26/2015      PSH Notes: Closed Treatment Of Clavicular Fracture On The Right   NON-GU PSH: Surgical Pathology, Gross And Microscopic Examination For Prostate Needle - 11/26/2015 Treat Clavicle Fracture - 2013    GU PMH: Prostate Cancer, Prostate cancer - 2017      PMH Notes:   1) Prostate cancer: He was found to have a PSA of 2.6  prompting a prostate biopsy on 09/19/11 which confirmed Gleason 3+3=6 adenocarcinoma of the prostate in 1 out of 12 biopsy cores. He has no family history of prostate cancer. He is very healthy and has no significant medical comorbid conditions. After discussing management options in detail with me and Dr. Tammi Navarro, he elected to proceed with active surveillance with delayed treatment of curative intent in the future. He places a high priority on his quality of life and wishes to delay definitive treatment as long as possible. He has a clear understanding of the risk of cancer progression while undergoing surveillance and has accepted that risk.   He was followed on active surveillance from June 2013 through August 2017 when he was noted to have upgraded Gleason 3+4=7 adenocarcinoma on surveillance biopsy at age 54. He was scheduled to follow up in the multidisciplinary clinic in the fall of 2017 but did not show for that appointment and apparently sought a 2nd opinion at [facility]. He again established care with me in October 2018 after receiving care and further evaluation at [facility]. He had a repeat MRI of the prostate demonstrating stability of his findings on his prior MRI. He also had an MR/US fusion biopsy that indicated Gleason 3+3=6 disease. After reviewing his options, he ultimately wished to come back to [facility] and proceed with definitive curative therapy.   Initial diagnosis: June 2013  TNM stage: cT1c Nx Mx  PSA: 2.6  Gleason score: 3+3=6  Biopsy (09/19/11): 1 /12 cores positive -- L lateral apex (5%)  Prostate volume: 21.7cc   Surveillance  Dec 2013: MRI - primary focus in right lateral mid gland and smaller focus in left lateral mid gland  Dec 2013: 26 core biopsy -- 3/26 cores positive - L lateral apex (5%, 3+3=6), R mid (2/2 cores, 10%, 10%, 3+3=6), Vol 26.9 cc, hypoechoic lesion at left apex  May 2015: 12 core biopsy - R mid (10%, 3+3=6), Vol 24.2 cc  Aug 2017: 12 core biopsy - 1/12 cores  positive - R mid (10%, 3+4=7)   Baseline urinary function: He denies LUTS. IPSS is 1.  Baseline erectile function: He denies erectile dysfunction. SHIM score is 25.     NON-GU PMH: Muscle weakness (generalized) - 02/20/2017 Other specified disorders of muscle - 02/20/2017    FAMILY HISTORY: Hypertension - Runs In Family No pertinent family history - Other   SOCIAL HISTORY: Marital Status: Married Preferred Language: English; Ethnicity: Not Hispanic Or Latino; Race: Black or African American Current Smoking Status: Patient has never smoked.  Light Drinker.  Drinks 1 caffeinated drink per day.     Notes: Never A Smoker, Marital History - Currently Married, Alcohol Use   REVIEW OF SYSTEMS:    GU Review Male:   Patient denies frequent urination, hard to postpone urination, burning/ pain with urination, get up at night to urinate, leakage of urine, stream starts and stops, trouble starting your stream, have to strain to urinate , erection problems, and penile pain.  Gastrointestinal (Upper):   Patient denies nausea, vomiting, and  indigestion/ heartburn.  Gastrointestinal (Lower):   Patient denies diarrhea and constipation.  Constitutional:   Patient denies fever, night sweats, weight loss, and fatigue.  Skin:   Patient denies skin rash/ lesion and itching.  Eyes:   Patient denies blurred vision and double vision.  Ears/ Nose/ Throat:   Patient denies sore throat and sinus problems.  Hematologic/Lymphatic:   Patient denies swollen glands and easy bruising.  Cardiovascular:   Patient denies chest pains and leg swelling.  Respiratory:   Patient denies cough and shortness of breath.  Endocrine:   Patient denies excessive thirst.  Musculoskeletal:   Patient denies back pain and joint pain.  Neurological:   Patient denies headaches and dizziness.  Psychologic:   Patient denies depression and anxiety.   VITAL SIGNS:      04/27/2017 11:23 AM  Weight 158 lb / 71.67 kg  Height 69 in / 175.26  cm  BP 111/74 mmHg  Pulse 71 /min  Temperature 98.0 F / 36.6 C  BMI 23.3 kg/m   MULTI-SYSTEM PHYSICAL EXAMINATION:    Constitutional: Well-nourished. No physical deformities. Normally developed. Good grooming.  Respiratory: Normal breath sounds. No labored breathing, no use of accessory muscles.   Cardiovascular: Regular rate and rhythm. No murmur, no gallop. Normal temperature, no swelling, no varicosities.   Skin: No paleness, no jaundice, no cyanosis. No lesion, no ulcer, no rash.  Neurologic / Psychiatric: Oriented to time, oriented to place, oriented to person. No depression, no anxiety, no agitation.  Gastrointestinal: No mass, no tenderness, no rigidity, non obese abdomen.  Musculoskeletal: Spine, ribs, pelvis no bilateral tenderness. Normal gait and station of head and neck.     PAST DATA REVIEWED:  Source Of History:  Patient  Lab Test Review:   PSA  Records Review:   Pathology Reports, Previous Patient Records  Urine Test Review:   Urinalysis   02/07/17 04/24/15 10/02/14 03/27/14 06/13/13 11/02/12 03/23/12  PSA  Total PSA 3.43 ng/mL 2.59  2.66  3.12  2.50  2.51  2.85     PROCEDURES:          Urinalysis Dipstick Dipstick Cont'd  Color: Amber Bilirubin: Neg  Appearance: Clear Ketones: Neg  Specific Gravity: 1.020 Blood: Neg  pH: 7.0 Protein: Trace  Glucose: Neg Urobilinogen: 0.2    Nitrites: Neg    Leukocyte Esterase: Neg    ASSESSMENT:      ICD-10 Details  1 GU:   Prostate Cancer - C61    PLAN:           Document Letter(s):  Created for Patient: Clinical Summary         Notes:   Urinalysis today is clear and he denies any changes in his overall health. We again discussed his upcoming procedure and I answered all of his questions to the best of my ability. He will follow up accordingly for his procedure.     * Signed by Adam Navarro on 04/30/17 at 1:06 PM (EST)*

## 2017-05-13 NOTE — Anesthesia Preprocedure Evaluation (Signed)
Anesthesia Evaluation  Patient identified by MRN, date of birth, ID band Patient awake    Reviewed: Allergy & Precautions, H&P , Patient's Chart, lab work & pertinent test results, reviewed documented beta blocker date and time   Airway Mallampati: II  TM Distance: >3 FB Neck ROM: full    Dental no notable dental hx.    Pulmonary    Pulmonary exam normal breath sounds clear to auscultation       Cardiovascular  Rhythm:regular Rate:Normal     Neuro/Psych    GI/Hepatic   Endo/Other    Renal/GU      Musculoskeletal   Abdominal   Peds  Hematology   Anesthesia Other Findings   Reproductive/Obstetrics                             Anesthesia Physical Anesthesia Plan  ASA: II  Anesthesia Plan: General   Post-op Pain Management:    Induction: Intravenous  PONV Risk Score and Plan: 2 and Dexamethasone, Ondansetron and Treatment may vary due to age or medical condition  Airway Management Planned: Oral ETT  Additional Equipment:   Intra-op Plan:   Post-operative Plan: Extubation in OR  Informed Consent: I have reviewed the patients History and Physical, chart, labs and discussed the procedure including the risks, benefits and alternatives for the proposed anesthesia with the patient or authorized representative who has indicated his/her understanding and acceptance.   Dental Advisory Given  Plan Discussed with: CRNA and Surgeon  Anesthesia Plan Comments: (  )        Anesthesia Quick Evaluation

## 2017-05-14 ENCOUNTER — Ambulatory Visit (HOSPITAL_COMMUNITY): Payer: Managed Care, Other (non HMO) | Admitting: Anesthesiology

## 2017-05-14 ENCOUNTER — Encounter (HOSPITAL_COMMUNITY): Payer: Self-pay | Admitting: *Deleted

## 2017-05-14 ENCOUNTER — Encounter (HOSPITAL_COMMUNITY)
Admission: RE | Disposition: A | Discharge status: Home / Self Care | Payer: Self-pay | Source: Ambulatory Visit | Attending: Urology

## 2017-05-14 ENCOUNTER — Observation Stay (HOSPITAL_COMMUNITY)
Admission: RE | Admit: 2017-05-14 | Discharge: 2017-05-15 | Disposition: A | Discharge status: Home / Self Care | Payer: Managed Care, Other (non HMO) | Source: Ambulatory Visit | Attending: Urology | Admitting: Urology

## 2017-05-14 ENCOUNTER — Other Ambulatory Visit: Payer: Self-pay

## 2017-05-14 DIAGNOSIS — Z881 Allergy status to other antibiotic agents status: Secondary | ICD-10-CM | POA: Diagnosis not present

## 2017-05-14 DIAGNOSIS — Z8249 Family history of ischemic heart disease and other diseases of the circulatory system: Secondary | ICD-10-CM | POA: Insufficient documentation

## 2017-05-14 DIAGNOSIS — C61 Malignant neoplasm of prostate: Principal | ICD-10-CM | POA: Insufficient documentation

## 2017-05-14 HISTORY — PX: ROBOT ASSISTED LAPAROSCOPIC RADICAL PROSTATECTOMY: SHX5141

## 2017-05-14 HISTORY — PX: LYMPHADENECTOMY: SHX5960

## 2017-05-14 LAB — BASIC METABOLIC PANEL
ANION GAP: 8 (ref 5–15)
BUN: 14 mg/dL (ref 6–20)
CO2: 26 mmol/L (ref 22–32)
Calcium: 8.4 mg/dL — ABNORMAL LOW (ref 8.9–10.3)
Chloride: 106 mmol/L (ref 101–111)
Creatinine, Ser: 1.02 mg/dL (ref 0.61–1.24)
Glucose, Bld: 128 mg/dL — ABNORMAL HIGH (ref 65–99)
Potassium: 4.2 mmol/L (ref 3.5–5.1)
SODIUM: 140 mmol/L (ref 135–145)

## 2017-05-14 LAB — TYPE AND SCREEN
ABO/RH(D): O NEG
ANTIBODY SCREEN: NEGATIVE

## 2017-05-14 LAB — HEMOGLOBIN AND HEMATOCRIT, BLOOD
HEMATOCRIT: 36.6 % — AB (ref 39.0–52.0)
HEMOGLOBIN: 11.8 g/dL — AB (ref 13.0–17.0)

## 2017-05-14 SURGERY — XI ROBOTIC ASSISTED LAPAROSCOPIC RADICAL PROSTATECTOMY LEVEL 2
Anesthesia: General

## 2017-05-14 MED ORDER — DIPHENHYDRAMINE HCL 50 MG/ML IJ SOLN: 12.5000 mg | Freq: Four times a day (QID) | INTRAMUSCULAR | Status: DC | PRN

## 2017-05-14 MED ORDER — KCL IN DEXTROSE-NACL 20-5-0.45 MEQ/L-%-% IV SOLN
INTRAVENOUS | Status: DC
  Administered 2017-05-14: 18:00:00 via INTRAVENOUS
  Filled 2017-05-14 (×3): qty 1000

## 2017-05-14 MED ORDER — ROCURONIUM BROMIDE 10 MG/ML (PF) SYRINGE
PREFILLED_SYRINGE | INTRAVENOUS | Status: DC | PRN
  Administered 2017-05-14 (×2): 10 mg via INTRAVENOUS
  Administered 2017-05-14: 50 mg via INTRAVENOUS

## 2017-05-14 MED ORDER — HYDROCODONE-ACETAMINOPHEN 5-325 MG PO TABS: 1.0000 | ORAL_TABLET | Freq: Four times a day (QID) | ORAL | 0 refills | Status: DC | PRN

## 2017-05-14 MED ORDER — PROMETHAZINE HCL 25 MG/ML IJ SOLN
INTRAMUSCULAR | Status: AC
  Filled 2017-05-14: qty 1

## 2017-05-14 MED ORDER — MIDAZOLAM HCL 2 MG/2ML IJ SOLN
INTRAMUSCULAR | Status: AC
  Filled 2017-05-14: qty 2

## 2017-05-14 MED ORDER — CEFAZOLIN SODIUM-DEXTROSE 2-4 GM/100ML-% IV SOLN
2.0000 g | Freq: Once | INTRAVENOUS | Status: AC
  Administered 2017-05-14: 2 g via INTRAVENOUS
  Filled 2017-05-14: qty 100

## 2017-05-14 MED ORDER — DOCUSATE SODIUM 100 MG PO CAPS
100.0000 mg | ORAL_CAPSULE | Freq: Two times a day (BID) | ORAL | Status: DC
  Administered 2017-05-14 – 2017-05-15 (×2): 100 mg via ORAL
  Filled 2017-05-14 (×2): qty 1

## 2017-05-14 MED ORDER — SUGAMMADEX SODIUM 200 MG/2ML IV SOLN
INTRAVENOUS | Status: DC | PRN
  Administered 2017-05-14: 150 mg via INTRAVENOUS

## 2017-05-14 MED ORDER — HYDROMORPHONE HCL 1 MG/ML IJ SOLN
INTRAMUSCULAR | Status: DC | PRN
  Administered 2017-05-14: 0.5 mg via INTRAVENOUS
  Administered 2017-05-14: 1 mg via INTRAVENOUS

## 2017-05-14 MED ORDER — ACETAMINOPHEN 325 MG PO TABS
650.0000 mg | ORAL_TABLET | ORAL | Status: DC | PRN
  Administered 2017-05-15: 650 mg via ORAL
  Filled 2017-05-14: qty 2

## 2017-05-14 MED ORDER — DEXAMETHASONE SODIUM PHOSPHATE 10 MG/ML IJ SOLN
INTRAMUSCULAR | Status: DC | PRN
  Administered 2017-05-14: 10 mg via INTRAVENOUS

## 2017-05-14 MED ORDER — SULFAMETHOXAZOLE-TRIMETHOPRIM 800-160 MG PO TABS: 1.0000 | ORAL_TABLET | Freq: Two times a day (BID) | ORAL | 0 refills | Status: DC

## 2017-05-14 MED ORDER — ONDANSETRON HCL 4 MG/2ML IJ SOLN
INTRAMUSCULAR | Status: DC | PRN
  Administered 2017-05-14: 4 mg via INTRAVENOUS

## 2017-05-14 MED ORDER — ONDANSETRON HCL 4 MG/2ML IJ SOLN
INTRAMUSCULAR | Status: AC
  Filled 2017-05-14: qty 2

## 2017-05-14 MED ORDER — DEXAMETHASONE SODIUM PHOSPHATE 10 MG/ML IJ SOLN
INTRAMUSCULAR | Status: AC
  Filled 2017-05-14: qty 1

## 2017-05-14 MED ORDER — MORPHINE SULFATE (PF) 4 MG/ML IV SOLN
2.0000 mg | INTRAVENOUS | Status: DC | PRN
  Administered 2017-05-14: 2 mg via INTRAVENOUS
  Filled 2017-05-14: qty 1

## 2017-05-14 MED ORDER — FENTANYL CITRATE (PF) 250 MCG/5ML IJ SOLN
INTRAMUSCULAR | Status: AC
  Filled 2017-05-14: qty 5

## 2017-05-14 MED ORDER — LACTATED RINGERS IV SOLN
INTRAVENOUS | Status: DC
  Administered 2017-05-14: 1000 mL via INTRAVENOUS
  Administered 2017-05-14: 11:00:00 via INTRAVENOUS

## 2017-05-14 MED ORDER — FENTANYL CITRATE (PF) 100 MCG/2ML IJ SOLN
INTRAMUSCULAR | Status: DC | PRN
  Administered 2017-05-14 (×2): 50 ug via INTRAVENOUS
  Administered 2017-05-14: 100 ug via INTRAVENOUS
  Administered 2017-05-14: 50 ug via INTRAVENOUS

## 2017-05-14 MED ORDER — EPHEDRINE 5 MG/ML INJ
INTRAVENOUS | Status: AC
  Filled 2017-05-14: qty 10

## 2017-05-14 MED ORDER — BUPIVACAINE-EPINEPHRINE 0.25% -1:200000 IJ SOLN
INTRAMUSCULAR | Status: DC | PRN
Start: 2017-05-14 — End: 2017-05-14
  Administered 2017-05-14: 30 mL

## 2017-05-14 MED ORDER — PROPOFOL 10 MG/ML IV BOLUS
INTRAVENOUS | Status: AC
  Filled 2017-05-14: qty 20

## 2017-05-14 MED ORDER — LIDOCAINE 2% (20 MG/ML) 5 ML SYRINGE
INTRAMUSCULAR | Status: DC | PRN
  Administered 2017-05-14: 50 mg via INTRAVENOUS

## 2017-05-14 MED ORDER — HYDROMORPHONE HCL 2 MG/ML IJ SOLN
INTRAMUSCULAR | Status: AC
  Filled 2017-05-14: qty 1

## 2017-05-14 MED ORDER — SODIUM CHLORIDE 0.9 % IR SOLN
Status: DC | PRN
  Administered 2017-05-14: 1000 mL

## 2017-05-14 MED ORDER — PROMETHAZINE HCL 25 MG/ML IJ SOLN
6.2500 mg | INTRAMUSCULAR | Status: DC | PRN
  Administered 2017-05-14: 6.25 mg via INTRAVENOUS

## 2017-05-14 MED ORDER — BUPIVACAINE-EPINEPHRINE 0.25% -1:200000 IJ SOLN
INTRAMUSCULAR | Status: AC
  Filled 2017-05-14: qty 1

## 2017-05-14 MED ORDER — HYDROMORPHONE HCL 1 MG/ML IJ SOLN: 0.2500 mg | INTRAMUSCULAR | Status: DC | PRN

## 2017-05-14 MED ORDER — KETOROLAC TROMETHAMINE 15 MG/ML IJ SOLN
15.0000 mg | Freq: Four times a day (QID) | INTRAMUSCULAR | Status: DC
  Administered 2017-05-14 – 2017-05-15 (×3): 15 mg via INTRAVENOUS
  Filled 2017-05-14 (×3): qty 1

## 2017-05-14 MED ORDER — EPHEDRINE SULFATE-NACL 50-0.9 MG/10ML-% IV SOSY
PREFILLED_SYRINGE | INTRAVENOUS | Status: DC | PRN
  Administered 2017-05-14: 5 mg via INTRAVENOUS

## 2017-05-14 MED ORDER — ONDANSETRON HCL 4 MG/2ML IJ SOLN
4.0000 mg | INTRAMUSCULAR | Status: DC | PRN
  Administered 2017-05-14: 4 mg via INTRAVENOUS

## 2017-05-14 MED ORDER — CEFAZOLIN SODIUM-DEXTROSE 1-4 GM/50ML-% IV SOLN
1.0000 g | Freq: Three times a day (TID) | INTRAVENOUS | Status: AC
  Administered 2017-05-14 (×2): 1 g via INTRAVENOUS
  Filled 2017-05-14 (×2): qty 50

## 2017-05-14 MED ORDER — PROPOFOL 10 MG/ML IV BOLUS
INTRAVENOUS | Status: DC | PRN
  Administered 2017-05-14: 120 mg via INTRAVENOUS

## 2017-05-14 MED ORDER — DIPHENHYDRAMINE HCL 12.5 MG/5ML PO ELIX
12.5000 mg | ORAL_SOLUTION | Freq: Four times a day (QID) | ORAL | Status: DC | PRN
Start: 2017-05-14 — End: 2017-05-15

## 2017-05-14 MED ORDER — SODIUM CHLORIDE 0.9 % IV BOLUS (SEPSIS)
1000.0000 mL | Freq: Once | INTRAVENOUS | Status: AC
  Administered 2017-05-14: 1000 mL via INTRAVENOUS

## 2017-05-14 MED ORDER — LACTATED RINGERS IV SOLN
INTRAVENOUS | Status: DC | PRN
  Administered 2017-05-14: 07:00:00

## 2017-05-14 MED ORDER — LIDOCAINE 2% (20 MG/ML) 5 ML SYRINGE
INTRAMUSCULAR | Status: AC
  Filled 2017-05-14: qty 5

## 2017-05-14 MED ORDER — STERILE WATER FOR IRRIGATION IR SOLN
Status: DC | PRN
  Administered 2017-05-14: 1000 mL

## 2017-05-14 MED ORDER — LACTATED RINGERS IV SOLN
INTRAVENOUS | Status: DC | PRN
  Administered 2017-05-14 (×2): via INTRAVENOUS

## 2017-05-14 MED ORDER — ORAL CARE MOUTH RINSE
15.0000 mL | Freq: Two times a day (BID) | OROMUCOSAL | Status: DC
  Administered 2017-05-14: 15 mL via OROMUCOSAL

## 2017-05-14 MED ORDER — MIDAZOLAM HCL 5 MG/5ML IJ SOLN
INTRAMUSCULAR | Status: DC | PRN
  Administered 2017-05-14: 2 mg via INTRAVENOUS

## 2017-05-14 MED ORDER — SUGAMMADEX SODIUM 200 MG/2ML IV SOLN
INTRAVENOUS | Status: AC
  Filled 2017-05-14: qty 2

## 2017-05-14 MED ORDER — HEPARIN SODIUM (PORCINE) 1000 UNIT/ML IJ SOLN
INTRAMUSCULAR | Status: AC
  Filled 2017-05-14: qty 1

## 2017-05-14 MED ORDER — ROCURONIUM BROMIDE 10 MG/ML (PF) SYRINGE
PREFILLED_SYRINGE | INTRAVENOUS | Status: AC
  Filled 2017-05-14: qty 5

## 2017-05-14 SURGICAL SUPPLY — 54 items
ADH SKN CLS APL DERMABOND .7 (GAUZE/BANDAGES/DRESSINGS)
APPLICATOR COTTON TIP 6IN STRL (MISCELLANEOUS) ×4 IMPLANT
CATH FOLEY 2WAY SLVR 18FR 30CC (CATHETERS) ×4 IMPLANT
CATH ROBINSON RED A/P 16FR (CATHETERS) ×4 IMPLANT
CATH ROBINSON RED A/P 8FR (CATHETERS) ×4 IMPLANT
CATH TIEMANN FOLEY 18FR 5CC (CATHETERS) ×4 IMPLANT
CHLORAPREP W/TINT 26ML (MISCELLANEOUS) ×4 IMPLANT
CLIP VESOLOCK LG 6/CT PURPLE (CLIP) ×8 IMPLANT
COVER SURGICAL LIGHT HANDLE (MISCELLANEOUS) ×4 IMPLANT
COVER TIP SHEARS 8 DVNC (MISCELLANEOUS) ×2 IMPLANT
COVER TIP SHEARS 8MM DA VINCI (MISCELLANEOUS) ×2
CUTTER ECHEON FLEX ENDO 45 340 (ENDOMECHANICALS) ×4 IMPLANT
DECANTER SPIKE VIAL GLASS SM (MISCELLANEOUS) ×4 IMPLANT
DERMABOND ADVANCED (GAUZE/BANDAGES/DRESSINGS)
DERMABOND ADVANCED .7 DNX12 (GAUZE/BANDAGES/DRESSINGS) IMPLANT
DRAPE ARM DVNC X/XI (DISPOSABLE) ×8 IMPLANT
DRAPE COLUMN DVNC XI (DISPOSABLE) ×2 IMPLANT
DRAPE DA VINCI XI ARM (DISPOSABLE) ×8
DRAPE DA VINCI XI COLUMN (DISPOSABLE) ×2
DRAPE SURG IRRIG POUCH 19X23 (DRAPES) ×4 IMPLANT
DRSG TEGADERM 4X4.75 (GAUZE/BANDAGES/DRESSINGS) ×4 IMPLANT
ELECT REM PT RETURN 15FT ADLT (MISCELLANEOUS) ×4 IMPLANT
GLOVE BIO SURGEON STRL SZ 6.5 (GLOVE) ×3 IMPLANT
GLOVE BIO SURGEONS STRL SZ 6.5 (GLOVE) ×1
GLOVE BIOGEL M STRL SZ7.5 (GLOVE) ×8 IMPLANT
GOWN STRL REUS W/TWL LRG LVL3 (GOWN DISPOSABLE) ×12 IMPLANT
HOLDER FOLEY CATH W/STRAP (MISCELLANEOUS) ×4 IMPLANT
IRRIG SUCT STRYKERFLOW 2 WTIP (MISCELLANEOUS) ×4
IRRIGATION SUCT STRKRFLW 2 WTP (MISCELLANEOUS) ×2 IMPLANT
NDL SAFETY ECLIPSE 18X1.5 (NEEDLE) ×2 IMPLANT
NEEDLE HYPO 18GX1.5 SHARP (NEEDLE) ×4
PACK ROBOT UROLOGY CUSTOM (CUSTOM PROCEDURE TRAY) ×4 IMPLANT
RELOAD STAPLE 45 4.1 GRN THCK (STAPLE) ×2 IMPLANT
SEAL CANN UNIV 5-8 DVNC XI (MISCELLANEOUS) ×8 IMPLANT
SEAL XI 5MM-8MM UNIVERSAL (MISCELLANEOUS) ×8
SOLUTION ELECTROLUBE (MISCELLANEOUS) ×4 IMPLANT
STAPLE RELOAD 45 GRN (STAPLE) ×2 IMPLANT
STAPLE RELOAD 45MM GREEN (STAPLE) ×4
SUT ETHILON 3 0 PS 1 (SUTURE) ×4 IMPLANT
SUT MNCRL 3 0 RB1 (SUTURE) ×2 IMPLANT
SUT MNCRL 3 0 VIOLET RB1 (SUTURE) ×2 IMPLANT
SUT MNCRL AB 4-0 PS2 18 (SUTURE) ×8 IMPLANT
SUT MONOCRYL 3 0 RB1 (SUTURE) ×4
SUT VIC AB 0 CT1 27 (SUTURE) ×4
SUT VIC AB 0 CT1 27XBRD ANTBC (SUTURE) ×2 IMPLANT
SUT VIC AB 0 UR5 27 (SUTURE) ×4 IMPLANT
SUT VIC AB 2-0 SH 27 (SUTURE) ×4
SUT VIC AB 2-0 SH 27X BRD (SUTURE) ×2 IMPLANT
SUT VICRYL 0 UR6 27IN ABS (SUTURE) ×8 IMPLANT
SYR 27GX1/2 1ML LL SAFETY (SYRINGE) ×4 IMPLANT
TOWEL OR 17X26 10 PK STRL BLUE (TOWEL DISPOSABLE) ×4 IMPLANT
TOWEL OR NON WOVEN STRL DISP B (DISPOSABLE) ×4 IMPLANT
TUBING INSUFFLATION 10FT LAP (TUBING) IMPLANT
WATER STERILE IRR 1000ML POUR (IV SOLUTION) ×8 IMPLANT

## 2017-05-14 NOTE — Discharge Instructions (Signed)

## 2017-05-14 NOTE — Anesthesia Postprocedure Evaluation (Signed)
Anesthesia Post Note  Patient: Adam Navarro  Procedure(s) Performed: XI ROBOTIC ASSISTED LAPAROSCOPIC RADICAL PROSTATECTOMY LEVEL 2 (N/A ) LYMPHADENECTOMY/ PELVIC (Bilateral )     Patient location during evaluation: PACU Anesthesia Type: General Level of consciousness: awake and alert Pain management: pain level controlled Vital Signs Assessment: post-procedure vital signs reviewed and stable Respiratory status: spontaneous breathing, nonlabored ventilation, respiratory function stable and patient connected to nasal cannula oxygen Cardiovascular status: blood pressure returned to baseline and stable Postop Assessment: no apparent nausea or vomiting Anesthetic complications: no    Last Vitals:  Vitals:   05/14/17 1345 05/14/17 1424  BP: 110/70 (!) 108/51  Pulse: 86 85  Resp: (!) 8 16  Temp: 36.8 C 36.6 C  SpO2: 99% 100%    Last Pain:  Vitals:   05/14/17 1721  TempSrc:   PainSc: Asleep                 Brittinie Wherley EDWARD

## 2017-05-14 NOTE — Interval H&P Note (Signed)
History and Physical Interval Note:  05/14/2017 6:58 AM  Adam Navarro  has presented today for surgery, with the diagnosis of PROSTATE CANCER  The various methods of treatment have been discussed with the patient and family. After consideration of risks, benefits and other options for treatment, the patient has consented to  Procedure(s): XI ROBOTIC ASSISTED LAPAROSCOPIC RADICAL PROSTATECTOMY LEVEL 2 (N/A) LYMPHADENECTOMY/ PELVIC (Bilateral) as a surgical intervention .  The patient's history has been reviewed, patient examined, no change in status, stable for surgery.  I have reviewed the patient's chart and labs.  Questions were answered to the patient's satisfaction.     Dajohn Ellender,LES

## 2017-05-14 NOTE — Anesthesia Procedure Notes (Signed)
Procedure Name: Intubation Date/Time: 05/14/2017 7:34 AM Performed by: Anne Fu, CRNA Pre-anesthesia Checklist: Patient identified, Emergency Drugs available, Suction available, Patient being monitored and Timeout performed Patient Re-evaluated:Patient Re-evaluated prior to induction Oxygen Delivery Method: Circle system utilized Preoxygenation: Pre-oxygenation with 100% oxygen Induction Type: IV induction Ventilation: Mask ventilation without difficulty Laryngoscope Size: Mac and 4 Grade View: Grade I Tube type: Oral Tube size: 7.5 mm Number of attempts: 1 Airway Equipment and Method: Stylet Placement Confirmation: ETT inserted through vocal cords under direct vision,  positive ETCO2 and breath sounds checked- equal and bilateral Secured at: 21 cm Tube secured with: Tape Dental Injury: Teeth and Oropharynx as per pre-operative assessment

## 2017-05-14 NOTE — Transfer of Care (Signed)
Immediate Anesthesia Transfer of Care Note  Patient: Adam Navarro  Procedure(s) Performed: Procedure(s): XI ROBOTIC ASSISTED LAPAROSCOPIC RADICAL PROSTATECTOMY LEVEL 2 (N/A) LYMPHADENECTOMY/ PELVIC (Bilateral)  Patient Location: PACU  Anesthesia Type:General  Level of Consciousness:  sedated, patient cooperative and responds to stimulation  Airway & Oxygen Therapy:Patient Spontanous Breathing and Patient connected to face mask oxgen  Post-op Assessment:  Report given to PACU RN and Post -op Vital signs reviewed and stable  Post vital signs:  Reviewed and stable  Last Vitals: There were no vitals filed for this visit.  Complications: No apparent anesthesia complications

## 2017-05-14 NOTE — Interval H&P Note (Signed)
History and Physical Interval Note:  05/14/2017 6:58 AM  Adam Navarro  has presented today for surgery, with the diagnosis of PROSTATE CANCER  The various methods of treatment have been discussed with the patient and family. After consideration of risks, benefits and other options for treatment, the patient has consented to  Procedure(s): XI ROBOTIC ASSISTED LAPAROSCOPIC RADICAL PROSTATECTOMY LEVEL 2 (N/A) LYMPHADENECTOMY/ PELVIC (Bilateral) as a surgical intervention .  The patient's history has been reviewed, patient examined, no change in status, stable for surgery.  I have reviewed the patient's chart and labs.  Questions were answered to the patient's satisfaction.     Herb Beltre,LES

## 2017-05-14 NOTE — Progress Notes (Signed)
Post-op note  Subjective: The patient is doing well.  He is having some nausea.  Pain well controlled.  Objective: Vital signs in last 24 hours: Temp:  [97.5 F (36.4 C)-97.6 F (36.4 C)] 97.6 F (36.4 C) (02/04 1145) Pulse Rate:  [83-91] 86 (02/04 1245) Resp:  [11-20] 14 (02/04 1245) BP: (104-122)/(68-77) 122/73 (02/04 1245) SpO2:  [91 %-100 %] 100 % (02/04 1245) Weight:  [73 kg (161 lb)] 73 kg (161 lb) (02/04 0614)  Intake/Output from previous day: No intake/output data recorded. Intake/Output this shift: Total I/O In: 2600 [I.V.:1600; IV Piggyback:1000] Out: 100 [Blood:100]  Physical Exam:  General: Alert and oriented. Abdomen: Soft, Nondistended. Incisions: Clean and dry. Urine: pink  Lab Results: Recent Labs    05/14/17 1020  HGB 11.8*  HCT 36.6*    Assessment/Plan: POD#0   1) Continue to monitor  2) DVT prophy, clears, IS, amb, pain control   LOS: 0 days   Debbrah Alar 05/14/2017, 1:45 PM

## 2017-05-14 NOTE — Op Note (Signed)

## 2017-05-14 NOTE — Plan of Care (Signed)
Cont to mon 

## 2017-05-14 NOTE — Progress Notes (Addendum)
Pt walked in hall. Tolerated well. Pain level 3/10 and comfortable. Called MD updated with lab results and potassium level. Order to continue with ordered fluids.

## 2017-05-15 DIAGNOSIS — C61 Malignant neoplasm of prostate: Secondary | ICD-10-CM | POA: Diagnosis not present

## 2017-05-15 LAB — HEMOGLOBIN AND HEMATOCRIT, BLOOD
HCT: 31.7 % — ABNORMAL LOW (ref 39.0–52.0)
HEMOGLOBIN: 10.7 g/dL — AB (ref 13.0–17.0)

## 2017-05-15 MED ORDER — BISACODYL 10 MG RE SUPP
10.0000 mg | Freq: Once | RECTAL | Status: AC
  Administered 2017-05-15: 10 mg via RECTAL
  Filled 2017-05-15: qty 1

## 2017-05-15 MED ORDER — HYDROCODONE-ACETAMINOPHEN 5-325 MG PO TABS: 1.0000 | ORAL_TABLET | Freq: Four times a day (QID) | ORAL | Status: DC | PRN

## 2017-05-15 NOTE — Care Management Note (Signed)
Case Management Note  Patient Details  Name: Adam Navarro MRN: [id number] Date of Birth: 05/17/1960  Subjective/Objective: 57 y/o m admitted w/Adendo Ca Prostate. From home. POD#1 lap radical prostatectomy.                    Action/Plan:d/c plan home.   Expected Discharge Date:                  Expected Discharge Plan:  Home/Self Care  In-House Referral:     Discharge planning Services  CM Consult  Post Acute Care Choice:    Choice offered to:     DME Arranged:    DME Agency:     HH Arranged:    HH Agency:     Status of Service:  In process, will continue to follow  If discussed at Long Length of Stay Meetings, dates discussed:    Additional Comments:  Dessa Phi, RN 05/15/2017, 11:09 AM

## 2017-05-15 NOTE — Progress Notes (Signed)
Patient report received from Melvenia Beam, RN. No change from initial pm assessment. Will continue to monitor and follow the POC.

## 2017-05-15 NOTE — Progress Notes (Signed)
Went over discharge papers with patient and family.  All questions answered.  VSS.  Foley and leg bag teaching completed.  Prescriptions given to patient.  Pt requested to walk out with family.

## 2017-05-15 NOTE — Progress Notes (Signed)
Patient ID: Adam Navarro, male   DOB: 10-28-1960, 57 y.o.   MRN: [id number]  1 Day Post-Op Subjective: The patient is doing well.  No nausea or vomiting. Pain is adequately controlled.  Objective: Vital signs in last 24 hours: Temp:  [97.5 F (36.4 C)-99.2 F (37.3 C)] 98.5 F (36.9 C) (02/05 0440) Pulse Rate:  [67-94] 67 (02/05 0440) Resp:  [7-20] 16 (02/05 0440) BP: (102-122)/(51-77) 102/67 (02/05 0440) SpO2:  [91 %-100 %] 100 % (02/05 0440) Weight:  [73 kg (161 lb)] 73 kg (161 lb) (02/04 1446)  Intake/Output from previous day: 02/04 0701 - 02/05 0700 In: 4385 [I.V.:3385; IV Piggyback:1000] Out: 2440 [Urine:2230; Drains:110; Blood:100] Intake/Output this shift: No intake/output data recorded.  Physical Exam:  General: Alert and oriented. CV: RRR Lungs: Clear bilaterally. GI: Soft, Nondistended. Incisions: Clean, dry, and intact Urine: Clear Extremities: Nontender, no erythema, no edema.  Lab Results: Recent Labs    05/14/17 1020 05/15/17 0547  HGB 11.8* 10.7*  HCT 36.6* 31.7*      Assessment/Plan: POD# 1 s/p robotic prostatectomy.  1) SL IVF 2) Ambulate, Incentive spirometry 3) Transition to oral pain medication 4) Dulcolax suppository 5) D/C pelvic drain 6) Plan for likely discharge later today   Pryor Curia. MD   LOS: 0 days   Dunia Pringle,LES 05/15/2017, 7:39 AM

## 2017-05-15 NOTE — Discharge Summary (Signed)
  Date of admission: 05/14/2017  Date of discharge: 05/15/2017  Admission diagnosis: Prostate Cancer  Discharge diagnosis: Prostate Cancer  History and Physical: For full details, please see admission history and physical. Briefly, Adam Navarro is a 57 y.o. gentleman with localized prostate cancer.  After discussing management/treatment options, he elected to proceed with surgical treatment.  Hospital Course: Adam Navarro was taken to the operating room on 05/14/2017 and underwent a robotic assisted laparoscopic radical prostatectomy. He tolerated this procedure well and without complications. Postoperatively, he was able to be transferred to a regular hospital room following recovery from anesthesia.  He was able to begin ambulating the night of surgery. He remained hemodynamically stable overnight.  He had excellent urine output with appropriately minimal output from his pelvic drain and his pelvic drain was removed on POD #1.  He was transitioned to oral pain medication, tolerated a clear liquid diet, and had met all discharge criteria and was able to be discharged home later on POD#1.  Laboratory values:  Recent Labs    05/14/17 1020 05/15/17 0547  HGB 11.8* 10.7*  HCT 36.6* 31.7*    Disposition: Home  Discharge instruction: He was instructed to be ambulatory but to refrain from heavy lifting, strenuous activity, or driving. He was instructed on urethral catheter care.  Discharge medications:   Allergies as of 05/15/2017      Reactions   Ciprofloxacin Hives      Medication List    STOP taking these medications   HYDROcodone-homatropine 5-1.5 MG/5ML syrup Commonly known as:  HYCODAN     TAKE these medications   HYDROcodone-acetaminophen 5-325 MG tablet Commonly known as:  NORCO Take 1-2 tablets by mouth every 6 (six) hours as needed for moderate pain or severe pain.   sulfamethoxazole-trimethoprim 800-160 MG tablet Commonly known as:  BACTRIM DS,SEPTRA DS Take 1 tablet  by mouth 2 (two) times daily. Start the day prior to foley removal appointment       Followup: He will followup in 1 week for catheter removal and to discuss his surgical pathology results.

## 2018-10-31 ENCOUNTER — Ambulatory Visit: Payer: Managed Care, Other (non HMO) | Admitting: Family Medicine

## 2018-10-31 ENCOUNTER — Telehealth: Payer: Self-pay

## 2018-10-31 NOTE — Telephone Encounter (Signed)
Questions for Screening COVID-19  Symptom onset: *n/a  Travel or Contacts: no  During this illness, did/does the patient experience any of the following symptoms? Fever >100.6F []   Yes [x]   No []   Unknown Subjective fever (felt feverish) []   Yes [x]   No []   Unknown Chills []   Yes [x]   No []   Unknown Muscle aches (myalgia) []   Yes [x]   No []   Unknown Runny nose (rhinorrhea) []   Yes [x]   No []   Unknown Sore throat []   Yes [x]   No []   Unknown Cough (new onset or worsening of chronic cough) []   Yes [x]   No []   Unknown Shortness of breath (dyspnea) []   Yes [x]   No []   Unknown Nausea or vomiting []   Yes [x]   No []   Unknown Headache []   Yes [x]   No []   Unknown Abdominal pain  []   Yes [x]   No []   Unknown Diarrhea (?3 loose/looser than normal stools/24hr period) []   Yes [x]   No []   Unknown Other, specify:  Patient risk factors: Smoker? []   Current []   Former []   Never If male, currently pregnant? []   Yes []   No  Patient Active Problem List   Diagnosis Date Noted  . 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 Chest pain 08/08/2009  . Incomplete bundle branch block 08/08/2009    Plan:  []   High risk for COVID-19 with red flags go to ED (with CP, SOB, weak/lightheaded, or fever > 101.5). Call ahead.  []   High risk for COVID-19 but stable. Inform provider and coordinate time for [facility] visit.   []   No red flags but URI signs or symptoms okay for [facility] visit.

## 2018-11-01 ENCOUNTER — Other Ambulatory Visit: Payer: Self-pay

## 2018-11-01 ENCOUNTER — Ambulatory Visit (INDEPENDENT_AMBULATORY_CARE_PROVIDER_SITE_OTHER): Payer: 59 | Admitting: Family Medicine

## 2018-11-01 ENCOUNTER — Encounter: Payer: Self-pay | Admitting: Family Medicine

## 2018-11-01 VITALS — BP 148/88 | HR 91 | Temp 98.2°F | Ht 69.0 in | Wt 174.6 lb

## 2018-11-01 DIAGNOSIS — Z0001 Encounter for general adult medical examination with abnormal findings: Secondary | ICD-10-CM

## 2018-11-01 DIAGNOSIS — R221 Localized swelling, mass and lump, neck: Secondary | ICD-10-CM | POA: Diagnosis not present

## 2018-11-01 DIAGNOSIS — Z23 Encounter for immunization: Secondary | ICD-10-CM

## 2018-11-01 DIAGNOSIS — R030 Elevated blood-pressure reading, without diagnosis of hypertension: Secondary | ICD-10-CM

## 2018-11-01 DIAGNOSIS — Z0000 Encounter for general adult medical examination without abnormal findings: Secondary | ICD-10-CM

## 2018-11-01 DIAGNOSIS — R600 Localized edema: Secondary | ICD-10-CM | POA: Diagnosis not present

## 2018-11-01 DIAGNOSIS — Z8546 Personal history of malignant neoplasm of prostate: Secondary | ICD-10-CM

## 2018-11-01 DIAGNOSIS — R7301 Impaired fasting glucose: Secondary | ICD-10-CM | POA: Diagnosis not present

## 2018-11-01 DIAGNOSIS — Z1159 Encounter for screening for other viral diseases: Secondary | ICD-10-CM

## 2018-11-01 NOTE — Patient Instructions (Signed)

## 2018-11-01 NOTE — Progress Notes (Signed)
Adam Navarro is a 58 y.o. male  Chief Complaint  Patient presents with  . Establish Care    CPE- not fasting/ wants tdap    HPI: Adam Navarro is a 58 y.o. male here to establish care with our office and for annual CPE. He is not fasting. He is unsure of last Tdap and will get this today. He is married, 2 grown children, 2 grandchildren. He works at [employer] at [employer]. He has a h/o prostate cancer and is s/p lap prostatectomy in 05/2017.   Last Colonoscopy: 01/2013 ([facility] Dr. Carlean Purl) - due in 2024  Specialists: urology Vision: due for exam, wears glasses Dental: due, he is established with dentists  Med refills needed today: n/a  Pt notes a "bump" on the back of his neck that has been there for months and ?a year. Not painful, tender. Does not interfere with mobility. He feels it changes in size - sometimes bigger and sometimes smaller. He denies any neck pain, restricted motion. When asked about numbness or tingling in arms but states yes but only when he lays or sleeps on his side/arm. No symptoms when working, driving, etc. No UE weakness. No change in grip strength. His wife later adds that previous PCP discussed referral to neurosurgeon but that was never done and pt has never had any imaging or eval. Wife also thinks pt has more symptoms than he is admitting to.   Pt notes LLE edema on/off x 2 weeks. Worse at end of the day, improved in AM. No pain, redness. No trauma. No SOB, DOE, orthopnea. Pt works at [employer] and is on his feet 8+ hrs per day. He does not drink much water. Denies high sodium diet.   Past Medical History:  Diagnosis Date  . Chest pain 08/2009   per patient saw a Dr Peter Martinique cardiology for chest pain several years ago ; all checked out fine with Stress Test and has not had any reccurence of chest pain since that time   . Incomplete bundle branch block 08/2009  . Pre-diabetes   . Prostate cancer ([place]) 09/19/11   Gleason 3+3=6, volume21.7  cc    Past Surgical History:  Procedure Laterality Date  . LYMPHADENECTOMY Bilateral 05/14/2017   Procedure: LYMPHADENECTOMY/ PELVIC;  Surgeon: Raynelle Bring, MD;  Location: [facility];  Service: Urology;  Laterality: Bilateral;  . PROSTATE BIOPSY    . ROBOT ASSISTED LAPAROSCOPIC RADICAL PROSTATECTOMY N/A 05/14/2017   Procedure: XI ROBOTIC ASSISTED LAPAROSCOPIC RADICAL PROSTATECTOMY LEVEL 2;  Surgeon: Raynelle Bring, MD;  Location: [facility];  Service: Urology;  Laterality: N/A;  . scapula  1973   R clavicular fracture    Social History   Socioeconomic History  . Marital status: Married    Spouse name: Not on file  . Number of children: Not on file  . Years of education: Not on file  . Highest education level: Not on file  Occupational History  . Occupation: works at [employer]  . Financial resource strain: Not on file  . Food insecurity    Worry: Not on file    Inability: Not on file  . Transportation needs    Medical: Not on file    Non-medical: Not on file  Tobacco Use  . Smoking status: Never Smoker  . Smokeless tobacco: Never Used  Substance and Sexual Activity  . Alcohol use: Yes    Alcohol/week: 0.0 - 1.0 standard drinks    Comment:  Pt states he drinks occasionally (beer and vodka)  . Drug use: No  . Sexual activity: Not on file  Lifestyle  . Physical activity    Days per week: Not on file    Minutes per session: Not on file  . Stress: Not on file  Relationships  . Social Herbalist on phone: Not on file    Gets together: Not on file    Attends religious service: Not on file    Active member of club or organization: Not on file    Attends meetings of clubs or organizations: Not on file    Relationship status: Not on file  . Intimate partner violence    Fear of current or ex partner: Not on file    Emotionally abused: Not on file    Physically abused: Not on file    Forced sexual activity: Not on file  Other Topics Concern  . Not on file   Social History Narrative   He is married, 2 grown children, 2 grandchildren. He works at [employer] at [employer].    Family History  Problem Relation Age of Onset  . Hypertension Mother   . Hypertension Father   . Colon cancer Neg Hx   . Esophageal cancer Neg Hx   . Stomach cancer Neg Hx   . Rectal cancer Neg Hx      Immunization History  Administered Date(s) Administered  . Influenza-Unspecified 02/25/2018  . Pneumococcal Polysaccharide-23 12/05/2013    Outpatient Encounter Medications as of 11/01/2018  Medication Sig  . [DISCONTINUED] HYDROcodone-acetaminophen (NORCO) 5-325 MG tablet Take 1-2 tablets by mouth every 6 (six) hours as needed for moderate pain or severe pain.  . [DISCONTINUED] sulfamethoxazole-trimethoprim (BACTRIM DS,SEPTRA DS) 800-160 MG tablet Take 1 tablet by mouth 2 (two) times daily. Start the day prior to foley removal appointment   No facility-administered encounter medications on file as of 11/01/2018.      ROS: Gen: no fever, chills  Skin: no rash, itching; posterior neck "bump"  ENT: no ear pain, ear drainage, nasal congestion, rhinorrhea, sinus pressure, sore throat Eyes: no blurry vision, double vision Resp: no cough, wheeze,SOB CV: no CP, palpitations, + LLE edema GI: no heartburn, n/v/d/c, abd pain GU: no dysuria, urgency, frequency, hematuria; no testicular swelling or masses MSK: no joint pain, myalgias, back pain Neuro: no dizziness, headache, weakness Psych: no depression, anxiety, insomnia   Allergies  Allergen Reactions  . Ciprofloxacin Hives    BP (!) 148/88   Pulse 91   Temp 98.2 F (36.8 C) (Oral)   Ht 5\' 9"  (1.753 m)   Wt 174 lb 9.6 oz (79.2 kg)   SpO2 99%   BMI 25.78 kg/m   BP Readings from Last 3 Encounters:  11/01/18 (!) 148/88  05/15/17 125/81  05/09/17 (!) 141/86   Wt Readings from Last 3 Encounters:  11/01/18 174 lb 9.6 oz (79.2 kg)  05/14/17 161 lb (73 kg)  05/09/17 161 lb (73 kg)      Physical Exam   Constitutional: He is oriented to person, place, and time. He appears well-developed and well-nourished. No distress.  HENT:  Head: Normocephalic and atraumatic.  Right Ear: Tympanic membrane and ear canal normal.  Left Ear: Tympanic membrane and ear canal normal.  Nose: Nose normal.  Mouth/Throat: Oropharynx is clear and moist and mucous membranes are normal.  Neck: Neck supple. No thyromegaly present.  Cardiovascular: Normal rate, regular rhythm, normal heart sounds and intact distal pulses.  Pulmonary/Chest: Effort normal and breath sounds normal. He has no wheezes. He has no rhonchi. He has no rales.  Abdominal: Soft. Bowel sounds are normal. He exhibits no distension and no mass. There is no abdominal tenderness. There is no rebound and no guarding.  Musculoskeletal: Normal range of motion.        General: Edema (+1 pedal edema LLE) present. No tenderness.       Back:  Lymphadenopathy:    He has no cervical adenopathy.  Neurological: He is alert and oriented to person, place, and time. He exhibits normal muscle tone. Coordination normal.  Skin: Skin is warm and dry.  Psychiatric: He has a normal mood and affect.     A/P:  1. Annual physical exam - due for dental and vision exams - Tdap today, will revisit shingrix vaccine next year - discussed importance of regular CV exercise, healthy diet, adequate sleep - UTD on colonoscopy - ALT; Future - AST; Future - Basic metabolic panel; Future - Lipid panel; Future - next CPE in 1 year  2. Impaired fasting glucose - Hemoglobin A1c; Future  3. Personal history of prostate cancer - follows with urology q61mo - PSA; Future  4. Need for Tdap vaccination - Tdap vaccine greater than or equal to 7yo IM  5. Need for hepatitis C screening test - Hepatitis C antibody; Future  6. Lower leg edema - intermittent x 2 wks, LLE - limit sodium intake, increase water intake with goal of 64oz per day, elevate legs when seated, consider  compression stocking or ACE wrap while at work/on feet - will check BMP - f/u if symptoms worsen or do not improve in 2-3 wks  7. Palpable mass of neck - posterior neck, inferior aspect; present x months or longer, size fluctuates - wife would like this evaluated as she thinks pt c/o tingling in arms at times, but feels tingling is more due to circulation and from him laying or sleeping on one side - will get [place] of posterior neck/spine for further eval/assessment  8. Elevated BP without diagnosis of hypertension - family h/o HTN in both parents - BP at [place] fluctuates and can be normal but also elevated - increase CV exercise, limit sodium - pt is coming in to office next week for labs and will have BP check at that time. If elevated, recommend home BP cuff and pt check BP 3-4x/wk x 2 wks and then f/u with me

## 2018-11-04 ENCOUNTER — Other Ambulatory Visit: Payer: Self-pay | Admitting: Family Medicine

## 2018-11-04 DIAGNOSIS — R221 Localized swelling, mass and lump, neck: Secondary | ICD-10-CM

## 2018-11-05 ENCOUNTER — Telehealth: Payer: Self-pay

## 2018-11-05 NOTE — Telephone Encounter (Signed)

## 2018-11-06 ENCOUNTER — Other Ambulatory Visit: Payer: Self-pay | Admitting: Family Medicine

## 2018-11-06 ENCOUNTER — Other Ambulatory Visit: Payer: Self-pay

## 2018-11-06 ENCOUNTER — Encounter: Payer: Self-pay | Admitting: Family Medicine

## 2018-11-06 ENCOUNTER — Other Ambulatory Visit (INDEPENDENT_AMBULATORY_CARE_PROVIDER_SITE_OTHER): Payer: Managed Care, Other (non HMO)

## 2018-11-06 DIAGNOSIS — Z0000 Encounter for general adult medical examination without abnormal findings: Secondary | ICD-10-CM

## 2018-11-06 DIAGNOSIS — R7301 Impaired fasting glucose: Secondary | ICD-10-CM | POA: Diagnosis not present

## 2018-11-06 DIAGNOSIS — Z1159 Encounter for screening for other viral diseases: Secondary | ICD-10-CM

## 2018-11-06 DIAGNOSIS — R221 Localized swelling, mass and lump, neck: Secondary | ICD-10-CM

## 2018-11-06 DIAGNOSIS — Z8546 Personal history of malignant neoplasm of prostate: Secondary | ICD-10-CM

## 2018-11-06 LAB — BASIC METABOLIC PANEL
BUN: 11 mg/dL (ref 6–23)
CO2: 30 mEq/L (ref 19–32)
Calcium: 9.9 mg/dL (ref 8.4–10.5)
Chloride: 100 mEq/L (ref 96–112)
Creatinine, Ser: 1.06 mg/dL (ref 0.40–1.50)
GFR: 87.18 mL/min (ref >60.00)
Glucose, Bld: 94 mg/dL (ref 70–99)
Potassium: 4.8 mEq/L (ref 3.5–5.1)
Sodium: 139 mEq/L (ref 135–145)

## 2018-11-06 LAB — LIPID PANEL
Cholesterol: 180 mg/dL (ref 0–200)
HDL: 47.4 mg/dL (ref >39.00)
LDL Cholesterol: 112 mg/dL — ABNORMAL HIGH (ref 0–99)
NonHDL: 132.51
Total CHOL/HDL Ratio: 4
Triglycerides: 103 mg/dL (ref 0.0–149.0)
VLDL: 20.6 mg/dL (ref 0.0–40.0)

## 2018-11-06 LAB — HEMOGLOBIN A1C: Hgb A1c MFr Bld: 6.2 % (ref 4.6–6.5)

## 2018-11-06 LAB — ALT: ALT: 23 U/L (ref 0–53)

## 2018-11-06 LAB — PSA: PSA: 0.01 ng/mL — ABNORMAL LOW (ref 0.10–4.00)

## 2018-11-06 LAB — AST: AST: 22 U/L (ref 0–37)

## 2018-11-08 ENCOUNTER — Ambulatory Visit
Admission: RE | Admit: 2018-11-08 | Discharge: 2018-11-08 | Disposition: A | Discharge status: Home / Self Care | Payer: Managed Care, Other (non HMO) | Source: Ambulatory Visit | Attending: Family Medicine | Admitting: Family Medicine

## 2018-11-08 DIAGNOSIS — R221 Localized swelling, mass and lump, neck: Secondary | ICD-10-CM

## 2018-11-11 LAB — HEPATITIS C ANTIBODY
Hepatitis C Ab: NONREACTIVE
SIGNAL TO CUT-OFF: 0.01 (ref <1.00)

## 2018-11-12 ENCOUNTER — Encounter: Payer: Self-pay | Admitting: Family Medicine

## 2018-12-03 ENCOUNTER — Encounter: Payer: Self-pay | Admitting: Family Medicine

## 2019-01-14 ENCOUNTER — Ambulatory Visit (INDEPENDENT_AMBULATORY_CARE_PROVIDER_SITE_OTHER): Payer: Managed Care, Other (non HMO)

## 2019-01-14 ENCOUNTER — Encounter: Payer: Self-pay | Admitting: Family Medicine

## 2019-01-14 DIAGNOSIS — Z23 Encounter for immunization: Secondary | ICD-10-CM

## 2019-01-14 NOTE — Progress Notes (Signed)
Pt came in for his flu vaccine. Vaccine given to in the left deltoid. Pt tolerated injection well. No signs/symptoms of a reaction prior to leaving the office. VIS given to pt.

## 2019-02-19 ENCOUNTER — Encounter: Payer: Self-pay | Admitting: Family Medicine

## 2019-03-14 ENCOUNTER — Encounter: Payer: Self-pay | Admitting: Family Medicine

## 2019-03-31 ENCOUNTER — Encounter: Payer: Self-pay | Admitting: Family Medicine

## 2019-08-06 ENCOUNTER — Encounter: Payer: Self-pay | Admitting: Family Medicine

## 2020-01-13 ENCOUNTER — Other Ambulatory Visit: Payer: Self-pay

## 2020-01-14 ENCOUNTER — Encounter: Payer: Self-pay | Admitting: Family Medicine

## 2020-01-14 ENCOUNTER — Ambulatory Visit: Payer: 59 | Admitting: Family Medicine

## 2020-01-14 VITALS — BP 140/82 | HR 59 | Temp 97.4°F | Ht 69.0 in | Wt 172.8 lb

## 2020-01-14 DIAGNOSIS — S7012XA Contusion of left thigh, initial encounter: Secondary | ICD-10-CM | POA: Diagnosis not present

## 2020-01-14 DIAGNOSIS — Z23 Encounter for immunization: Secondary | ICD-10-CM | POA: Diagnosis not present

## 2020-01-14 NOTE — Addendum Note (Signed)
Addended by: Lucila Maine on: 01/14/2020 01:35 PM   Modules accepted: Orders

## 2020-01-14 NOTE — Progress Notes (Signed)
Adam Navarro is a 59 y.o. male  Chief Complaint  Patient presents with  . Acute Visit    bruise on back of LT leg x 1 week, no injury.,wants flu shot today.    HPI: Adam Navarro is a 59 y.o. male who complains of a bruise on the back of his Lt upper leg x 1 week. Denies injury/trauma. No pain. No itch. No redness, warmth. Mild soreness with applied pressure. No swelling. No CP, SOB. No fever, chills.  No changes in activity level or exercise.   Pt would like flu vaccine today.  Past Medical History:  Diagnosis Date  . Chest pain 08/2009   per patient saw a Dr Peter Martinique cardiology for chest pain several years ago ; all checked out fine with Stress Test and has not had any reccurence of chest pain since that time   . Incomplete bundle branch block 08/2009  . Pre-diabetes   . Prostate cancer ([place]) 09/19/11   Gleason 3+3=6, volume21.7 cc    Past Surgical History:  Procedure Laterality Date  . LYMPHADENECTOMY Bilateral 05/14/2017   Procedure: LYMPHADENECTOMY/ PELVIC;  Surgeon: Raynelle Bring, MD;  Location: [facility];  Service: Urology;  Laterality: Bilateral;  . PROSTATE BIOPSY    . ROBOT ASSISTED LAPAROSCOPIC RADICAL PROSTATECTOMY N/A 05/14/2017   Procedure: XI ROBOTIC ASSISTED LAPAROSCOPIC RADICAL PROSTATECTOMY LEVEL 2;  Surgeon: Raynelle Bring, MD;  Location: [facility];  Service: Urology;  Laterality: N/A;  . scapula  1973   R clavicular fracture    Social History   Socioeconomic History  . Marital status: Married    Spouse name: Not on file  . Number of children: Not on file  . Years of education: Not on file  . Highest education level: Not on file  Occupational History  . Occupation: works at [employer]  . Smoking status: Never Smoker  . Smokeless tobacco: Never Used  Vaping Use  . Vaping Use: Never used  Substance and Sexual Activity  . Alcohol use: Yes    Alcohol/week: 0.0 - 1.0 standard drinks    Comment: Pt states he drinks occasionally (beer and  vodka)  . Drug use: No  . Sexual activity: Not on file  Other Topics Concern  . Not on file  Social History Narrative   He is married, 2 grown children, 2 grandchildren. He works at [employer] at [employer].   Social Determinants of Health   Financial Resource Strain:   . Difficulty of Paying Living Expenses: Not on file  Food Insecurity:   . Worried About Charity fundraiser in the Last Year: Not on file  . Ran Out of Food in the Last Year: Not on file  Transportation Needs:   . Lack of Transportation (Medical): Not on file  . Lack of Transportation (Non-Medical): Not on file  Physical Activity:   . Days of Exercise per Week: Not on file  . Minutes of Exercise per Session: Not on file  Stress:   . Feeling of Stress : Not on file  Social Connections:   . Frequency of Communication with Friends and Family: Not on file  . Frequency of Social Gatherings with Friends and Family: Not on file  . Attends Religious Services: Not on file  . Active Member of Clubs or Organizations: Not on file  . Attends Archivist Meetings: Not on file  . Marital Status: Not on file  Intimate Partner Violence:   . Fear of  Current or Ex-Partner: Not on file  . Emotionally Abused: Not on file  . Physically Abused: Not on file  . Sexually Abused: Not on file    Family History  Problem Relation Age of Onset  . Hypertension Mother   . Hypertension Father   . Colon cancer Neg Hx   . Esophageal cancer Neg Hx   . Stomach cancer Neg Hx   . Rectal cancer Neg Hx      Immunization History  Administered Date(s) Administered  . Influenza,inj,Quad PF,6+ Mos 01/14/2019  . Influenza-Unspecified 02/25/2018  . PFIZER SARS-COV-2 Vaccination 06/27/2019, 07/18/2019  . Pneumococcal Polysaccharide-23 12/05/2013  . Tdap 11/01/2018    No outpatient encounter medications on file as of 01/14/2020.   No facility-administered encounter medications on file as of 01/14/2020.     ROS: Pertinent positives  and negatives noted in HPI. Remainder of ROS non-contributory    Allergies  Allergen Reactions  . Ciprofloxacin Hives    BP 140/82   Pulse (!) 59   Temp (!) 97.4 F (36.3 C) (Temporal)   Ht 5\' 9"  (1.753 m)   Wt 172 lb 12.8 oz (78.4 kg)   SpO2 99%   BMI 25.52 kg/m   Physical Exam Constitutional:      General: He is not in acute distress.    Appearance: Normal appearance. He is not ill-appearing.  Cardiovascular:     Pulses: Normal pulses.  Skin:      Neurological:     Mental Status: He is alert and oriented to person, place, and time.  Psychiatric:        Mood and Affect: Mood normal.        Behavior: Behavior normal.      A/P:  1. Traumatic ecchymosis of left thigh, initial encounter - pt does not recall trauma/injury - asymptomatic - no bleeding, no CP, SOB, edema, TTP, warmth - present x 1 week so will monitor for slow resolution over the next 2 wks and if no/minimal improvement, pt will f/u via MyChart.   This visit occurred during the SARS-CoV-2 public health emergency.  Safety protocols were in place, including screening questions prior to the visit, additional usage of staff PPE, and extensive cleaning of exam room while observing appropriate contact time as indicated for disinfecting solutions.

## 2020-01-19 ENCOUNTER — Encounter: Payer: Self-pay | Admitting: Family Medicine

## 2020-02-12 ENCOUNTER — Encounter: Payer: Self-pay | Admitting: Family Medicine

## 2020-02-25 ENCOUNTER — Encounter: Payer: Self-pay | Admitting: Family Medicine

## 2020-02-25 ENCOUNTER — Other Ambulatory Visit: Payer: Self-pay

## 2020-02-25 ENCOUNTER — Ambulatory Visit: Payer: 59 | Admitting: Family Medicine

## 2020-02-25 VITALS — BP 134/82 | HR 79 | Temp 98.3°F | Wt 171.6 lb

## 2020-02-25 DIAGNOSIS — R1031 Right lower quadrant pain: Secondary | ICD-10-CM

## 2020-02-25 NOTE — Progress Notes (Signed)
Adam Navarro is a 59 y.o. male  Chief Complaint  Patient presents with  . Hernia    Hernia x 6 months pain becoming worse x 3 weeks.     HPI: Adam Navarro is a 59 y.o. male who complains of 6 mo h/o Rt inguinal hernia. He asked urologist about it who told him it was a hernia but nothing needed to be done if asymptomatic. Pt states in the past 3 wks he has started to experience intermittent pain in Rt groin. Pt describes pain as "sharp", "like something ripping". Wife feels size of hernia has increased. No scrotal swelling.   Past Medical History:  Diagnosis Date  . Chest pain 08/2009   per patient saw a Dr Peter Martinique cardiology for chest pain several years ago ; all checked out fine with Stress Test and has not had any reccurence of chest pain since that time   . Incomplete bundle branch block 08/2009  . Pre-diabetes   . Prostate cancer ([place]) 09/19/11   Gleason 3+3=6, volume21.7 cc    Past Surgical History:  Procedure Laterality Date  . LYMPHADENECTOMY Bilateral 05/14/2017   Procedure: LYMPHADENECTOMY/ PELVIC;  Surgeon: Raynelle Bring, MD;  Location: [facility];  Service: Urology;  Laterality: Bilateral;  . PROSTATE BIOPSY    . ROBOT ASSISTED LAPAROSCOPIC RADICAL PROSTATECTOMY N/A 05/14/2017   Procedure: XI ROBOTIC ASSISTED LAPAROSCOPIC RADICAL PROSTATECTOMY LEVEL 2;  Surgeon: Raynelle Bring, MD;  Location: [facility];  Service: Urology;  Laterality: N/A;  . scapula  1973   R clavicular fracture    Social History   Socioeconomic History  . Marital status: Married    Spouse name: Not on file  . Number of children: Not on file  . Years of education: Not on file  . Highest education level: Not on file  Occupational History  . Occupation: works at [employer]  . Smoking status: Never Smoker  . Smokeless tobacco: Never Used  Vaping Use  . Vaping Use: Never used  Substance and Sexual Activity  . Alcohol use: Yes    Alcohol/week: 0.0 - 1.0 standard drinks    Comment:  Pt states he drinks occasionally (beer and vodka)  . Drug use: No  . Sexual activity: Not on file  Other Topics Concern  . Not on file  Social History Narrative   He is married, 2 grown children, 2 grandchildren. He works at [employer] at [employer].   Social Determinants of Health   Financial Resource Strain:   . Difficulty of Paying Living Expenses: Not on file  Food Insecurity:   . Worried About Charity fundraiser in the Last Year: Not on file  . Ran Out of Food in the Last Year: Not on file  Transportation Needs:   . Lack of Transportation (Medical): Not on file  . Lack of Transportation (Non-Medical): Not on file  Physical Activity:   . Days of Exercise per Week: Not on file  . Minutes of Exercise per Session: Not on file  Stress:   . Feeling of Stress : Not on file  Social Connections:   . Frequency of Communication with Friends and Family: Not on file  . Frequency of Social Gatherings with Friends and Family: Not on file  . Attends Religious Services: Not on file  . Active Member of Clubs or Organizations: Not on file  . Attends Archivist Meetings: Not on file  . Marital Status: Not on file  Intimate  Partner Violence:   . Fear of Current or Ex-Partner: Not on file  . Emotionally Abused: Not on file  . Physically Abused: Not on file  . Sexually Abused: Not on file    Family History  Problem Relation Age of Onset  . Hypertension Mother   . Hypertension Father   . Colon cancer Neg Hx   . Esophageal cancer Neg Hx   . Stomach cancer Neg Hx   . Rectal cancer Neg Hx      Immunization History  Administered Date(s) Administered  . Influenza,inj,Quad PF,6+ Mos 01/14/2019, 01/14/2020  . Influenza-Unspecified 02/25/2018  . PFIZER SARS-COV-2 Vaccination 06/27/2019, 07/18/2019  . Pneumococcal Polysaccharide-23 12/05/2013  . Tdap 11/01/2018    Outpatient Encounter Medications as of 02/25/2020  Medication Sig  . tadalafil (CIALIS) 20 MG tablet Take 20 mg by  mouth daily as needed.   No facility-administered encounter medications on file as of 02/25/2020.     ROS: Pertinent positives and negatives noted in HPI. Remainder of ROS non-contributory   Allergies  Allergen Reactions  . Ciprofloxacin Hives    BP 134/82   Pulse 79   Temp 98.3 F (36.8 C) (Tympanic)   Wt 171 lb 9.6 oz (77.8 kg)   SpO2 97%   BMI 25.34 kg/m   Physical Exam Constitutional:      General: He is not in acute distress.    Appearance: Normal appearance. He is not ill-appearing.  Abdominal:     Hernia: There is no hernia in the left inguinal area or right inguinal area.     Comments: ? Small Rt inguinal hernia  Genitourinary:    Testes:        Right: Tenderness or swelling not present.  Lymphadenopathy:     Lower Body: No right inguinal adenopathy.  Neurological:     Mental Status: He is alert.  Psychiatric:        Mood and Affect: Mood normal.        Behavior: Behavior normal.      A/P:  1. Right lower quadrant abdominal pain 2. Rt groin pain - CT Abdomen Pelvis Wo Contrast; Future - Ambulatory referral to General Surgery    This visit occurred during the SARS-CoV-2 public health emergency.  Safety protocols were in place, including screening questions prior to the visit, additional usage of staff PPE, and extensive cleaning of exam room while observing appropriate contact time as indicated for disinfecting solutions.

## 2020-03-15 ENCOUNTER — Other Ambulatory Visit: Payer: Self-pay | Admitting: Family Medicine

## 2020-03-17 ENCOUNTER — Other Ambulatory Visit: Payer: Self-pay

## 2020-03-17 ENCOUNTER — Ambulatory Visit
Admission: RE | Admit: 2020-03-17 | Discharge: 2020-03-17 | Disposition: A | Discharge status: Home / Self Care | Payer: 59 | Source: Ambulatory Visit | Attending: Family Medicine | Admitting: Family Medicine

## 2020-03-17 DIAGNOSIS — R1031 Right lower quadrant pain: Secondary | ICD-10-CM

## 2020-03-24 ENCOUNTER — Encounter: Payer: Self-pay | Admitting: Family Medicine

## 2020-04-22 ENCOUNTER — Telehealth: Payer: Self-pay | Admitting: Family Medicine

## 2020-04-22 NOTE — Telephone Encounter (Signed)
Pt has surgery scheduled on 04/30/20 and he needs a 2day PCR nasal test done before the appointment but we dont do that at this location. I was advised by my supervisor that the surgery center is usually the one that handles that and I let the pt know and he said that they had tried to set him up with a time frame to get it done but that it didn't work with his schedule. Pt going to call them back to see what options he has

## 2020-06-08 HISTORY — PX: HERNIA REPAIR: SHX51

## 2021-01-19 ENCOUNTER — Other Ambulatory Visit: Payer: Self-pay

## 2021-01-21 ENCOUNTER — Other Ambulatory Visit: Payer: Self-pay

## 2021-01-21 ENCOUNTER — Ambulatory Visit: Payer: 59 | Admitting: Family Medicine

## 2021-01-21 ENCOUNTER — Encounter: Payer: Self-pay | Admitting: Family Medicine

## 2021-01-21 VITALS — BP 132/76 | HR 67 | Temp 97.7°F | Ht 69.0 in | Wt 171.4 lb

## 2021-01-21 DIAGNOSIS — C61 Malignant neoplasm of prostate: Secondary | ICD-10-CM

## 2021-01-21 DIAGNOSIS — R7301 Impaired fasting glucose: Secondary | ICD-10-CM | POA: Diagnosis not present

## 2021-01-21 DIAGNOSIS — N5231 Erectile dysfunction following radical prostatectomy: Secondary | ICD-10-CM | POA: Diagnosis not present

## 2021-01-21 DIAGNOSIS — Z8546 Personal history of malignant neoplasm of prostate: Secondary | ICD-10-CM

## 2021-01-21 DIAGNOSIS — Z23 Encounter for immunization: Secondary | ICD-10-CM | POA: Diagnosis not present

## 2021-01-21 DIAGNOSIS — R600 Localized edema: Secondary | ICD-10-CM

## 2021-01-21 LAB — BASIC METABOLIC PANEL
BUN: 17 mg/dL (ref 6–23)
CO2: 27 mEq/L (ref 19–32)
Calcium: 9.7 mg/dL (ref 8.4–10.5)
Chloride: 101 mEq/L (ref 96–112)
Creatinine, Ser: 1.1 mg/dL (ref 0.40–1.50)
GFR: 73.72 mL/min (ref >60.00)
Glucose, Bld: 97 mg/dL (ref 70–99)
Potassium: 4.4 mEq/L (ref 3.5–5.1)
Sodium: 136 mEq/L (ref 135–145)

## 2021-01-21 LAB — HEMOGLOBIN A1C: Hgb A1c MFr Bld: 6.2 % (ref 4.6–6.5)

## 2021-01-21 MED ORDER — TADALAFIL 10 MG PO TABS: 10.0000 mg | ORAL_TABLET | Freq: Every day | ORAL | 1 refills | Status: AC | PRN

## 2021-01-21 NOTE — Progress Notes (Addendum)
[facility] [address] [place] [address] Dept: [phone] Dept Fax: [phone]  Transfer of Care Office Visit  Subjective:    Patient ID: Adam Navarro, male    DOB: [birth year], 60 y.o..   MRN: [id number]  Chief Complaint  Patient presents with   Establish Care    [facility]- establish care.   C/o having LT leg swelling x months.  Wants flu shot today.       History of Present Illness:  Patient is in today to establish care. Mr. Wieting was born in [place] and has lived here all his life. He has been married [place]) for 34 years. He has 2 kids (35, 61) and 3 gradchildren. One daughter lives in [place] and is an Public librarian. He works as a Production manager for [employer]. He denies any tobacco or drug use and only rarely drinks alcohol.  Mr. Mancera has a history of prediabetes. He wonders if this may have progressed,a s he intermittently has some tingling in his feet. He notes his parents do have diabetes.  Mr. Housman has a history of prostate cancer and is s/p radical prostatectomy. He is followed by Dr. Alinda Money. His PSA tests have been very low. He does have some resulting ED. He has more recently been on 5 mg of Cialis daily as needed. He notes that his wife feels his erections are not as adequate, so he wonders about going up on the dose.  Mr. Gassett notes he has had some swelling of the left lower leg for the past 2 years or more. He states this fluctuates at times. This is not painful.  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 and a PSA of 2.65 - Favorable Risk Stage I 09/19/2011   Past Surgical History:  Procedure Laterality Date   HERNIA REPAIR  06/2020   LYMPHADENECTOMY  Bilateral 05/14/2017   Procedure: LYMPHADENECTOMY/ PELVIC;  Surgeon: Raynelle Bring, MD;  Location: [facility];  Service: Urology;  Laterality: Bilateral;   ORIF CLAVICLE FRACTURE Right 1973   R clavicular fracture   PROSTATE BIOPSY     ROBOT ASSISTED LAPAROSCOPIC RADICAL PROSTATECTOMY N/A 05/14/2017   Procedure: XI ROBOTIC ASSISTED LAPAROSCOPIC RADICAL PROSTATECTOMY LEVEL 2;  Surgeon: Raynelle Bring, MD;  Location: [facility];  Service: Urology;  Laterality: N/A;   Family History  Problem Relation Age of Onset   Diabetes Mother    Hypertension Mother    Kidney disease Father    Diabetes Father    Hypertension Father    Cancer Paternal Uncle        Throat   Colon cancer Neg Hx    Esophageal cancer Neg Hx    Stomach cancer Neg Hx    Rectal cancer Neg Hx    Outpatient Medications Prior to Visit  Medication Sig Dispense Refill   tadalafil (CIALIS) 20 MG tablet Take 10 mg by mouth daily as needed.     No facility-administered medications prior to visit.   Allergies  Allergen Reactions   Ciprofloxacin Hives     Objective:   Today's Vitals   01/21/21 1010  BP: 132/76  Pulse: 67  Temp: 97.7 F (36.5 C)  TempSrc: Temporal  SpO2: 99%  Weight: 171 lb 6.4 oz (77.7  kg)  Height: 5\' 9"  (1.753 m)   Body mass index is 25.31 kg/m.   General: Well developed, well nourished. No acute distress. Extremities: 1+ edema of the left lower leg. Nontender. No redness noted. Psych: Alert and oriented. Normal mood and affect.  Health Maintenance Due  Topic Date Due   HIV Screening  Never done   Zoster Vaccines- Shingrix (1 of 2) Never done   COVID-19 Vaccine (3 - Pfizer risk series) 08/15/2019     Assessment & Plan:   1. History of prostate cancer Following with Dr. Alinda Money. He has apparently had a cure.  2. Erectile dysfunction after radical prostatectomy We will try increasing his Cialis to 10 mg and see if this is more effective.  - tadalafil (CIALIS) 10 MG tablet; Take 1 tablet (10 mg  total) by mouth daily as needed for erectile dysfunction.  Dispense: 90 tablet; Refill: 1  3. Leg edema, left Likely a complication of lymph node dissection related to his prostatectomy. We discussed that compression stockings may help. He will monitor this for now, but I would be willing to send him for a vein assessment if this becomes bothersome.  - Basic metabolic panel  4. Impaired fasting glucose  - Hemoglobin A1c  5. Need for influenza vaccination  - Flu Vaccine QUAD 6+ mos PF IM (Fluarix Quad PF)  Haydee Salter, MD

## 2021-02-01 DIAGNOSIS — Z8546 Personal history of malignant neoplasm of prostate: Secondary | ICD-10-CM | POA: Insufficient documentation

## 2021-04-21 ENCOUNTER — Emergency Department (HOSPITAL_BASED_OUTPATIENT_CLINIC_OR_DEPARTMENT_OTHER): Payer: 59

## 2021-04-21 ENCOUNTER — Emergency Department (HOSPITAL_COMMUNITY)
Admission: EM | Admit: 2021-04-21 | Discharge: 2021-04-21 | Disposition: A | Discharge status: Home / Self Care | Payer: 59 | Attending: Emergency Medicine | Admitting: Emergency Medicine

## 2021-04-21 ENCOUNTER — Emergency Department (HOSPITAL_COMMUNITY): Payer: 59

## 2021-04-21 ENCOUNTER — Other Ambulatory Visit: Payer: Self-pay

## 2021-04-21 ENCOUNTER — Encounter (HOSPITAL_COMMUNITY): Payer: Self-pay | Admitting: Emergency Medicine

## 2021-04-21 DIAGNOSIS — Z8546 Personal history of malignant neoplasm of prostate: Secondary | ICD-10-CM | POA: Diagnosis not present

## 2021-04-21 DIAGNOSIS — M79602 Pain in left arm: Secondary | ICD-10-CM

## 2021-04-21 LAB — CBC WITH DIFFERENTIAL/PLATELET
Abs Immature Granulocytes: 0.02 10*3/uL (ref 0.00–0.07)
Basophils Absolute: 0 10*3/uL (ref 0.0–0.1)
Basophils Relative: 1 %
Eosinophils Absolute: 0.1 10*3/uL (ref 0.0–0.5)
Eosinophils Relative: 1 %
HCT: 44.7 % (ref 39.0–52.0)
Hemoglobin: 14.5 g/dL (ref 13.0–17.0)
Immature Granulocytes: 0 %
Lymphocytes Relative: 25 %
Lymphs Abs: 1.6 10*3/uL (ref 0.7–4.0)
MCH: 27.9 pg (ref 26.0–34.0)
MCHC: 32.4 g/dL (ref 30.0–36.0)
MCV: 86.1 fL (ref 80.0–100.0)
Monocytes Absolute: 0.5 10*3/uL (ref 0.1–1.0)
Monocytes Relative: 8 %
Neutro Abs: 4.2 10*3/uL (ref 1.7–7.7)
Neutrophils Relative %: 65 %
Platelets: 229 10*3/uL (ref 150–400)
RBC: 5.19 MIL/uL (ref 4.22–5.81)
RDW: 13.6 % (ref 11.5–15.5)
WBC: 6.3 10*3/uL (ref 4.0–10.5)
nRBC: 0 % (ref 0.0–0.2)

## 2021-04-21 LAB — BASIC METABOLIC PANEL
Anion gap: 5 (ref 5–15)
BUN: 18 mg/dL (ref 6–20)
CO2: 27 mmol/L (ref 22–32)
Calcium: 9.2 mg/dL (ref 8.9–10.3)
Chloride: 105 mmol/L (ref 98–111)
Creatinine, Ser: 0.93 mg/dL (ref 0.61–1.24)
GFR, Estimated: >60 mL/min (ref >60)
Glucose, Bld: 111 mg/dL — ABNORMAL HIGH (ref 70–99)
Potassium: 4.3 mmol/L (ref 3.5–5.1)
Sodium: 137 mmol/L (ref 135–145)

## 2021-04-21 LAB — TROPONIN I (HIGH SENSITIVITY): Troponin I (High Sensitivity): 3 ng/L (ref <18)

## 2021-04-21 NOTE — Progress Notes (Signed)
Left upper extremity venous duplex has been completed. Preliminary results can be found in CV Proc through chart review.  Results were given to Theodis Blaze PA.  04/21/21 9:53 AM Adam Navarro RVT

## 2021-04-21 NOTE — ED Provider Notes (Signed)
[place] DEPT Provider Note   CSN: [id number] Arrival date & time: 04/21/21  0445     History  Chief Complaint  Patient presents with   Arm Pain    Adam Navarro is a 61 y.o. male.  With remote history of prostate cancer in remission who presents emergency department with left arm pain.  Patient states that beginning yesterday he began having intermittent pain in his left arm.  He states that he disregarded it however this morning he woke up with arm pain that woke him up out of his sleep.  He states that the pain feels like "tight handcuffs around his wrist" and he also has a squeezing sensation in his left bicep.  He denies any numbness or tingling in his right arm.  He denies weakness of the extremity, swelling, trauma.  He denies chest pain, back pain, pain in his jaw, shortness of breath, nausea or diaphoresis.  He denies history of DVT or PE.  He denies smoking, hormone use with prostate cancer, recent long trips or travel, surgery.   Arm Pain      Home Medications Prior to Admission medications   Medication Sig Start Date End Date Taking? Authorizing Provider  tadalafil (CIALIS) 10 MG tablet Take 1 tablet (10 mg total) by mouth daily as needed for erectile dysfunction. 01/21/21   Haydee Salter, MD      Allergies    Ciprofloxacin    Review of Systems   Review of Systems  Musculoskeletal:  Positive for myalgias.  All other systems reviewed and are negative.  Physical Exam Updated Vital Signs BP (!) 143/92 (BP Location: Right Arm)    Pulse 61    Temp 97.9 F (36.6 C) (Oral)    Resp 16    Ht 5\' 9"  (1.753 m)    Wt 74.8 kg    SpO2 98%    BMI 24.37 kg/m  Physical Exam Vitals and nursing note reviewed.  Constitutional:      General: He is not in acute distress.    Appearance: Normal appearance. He is normal weight. He is not ill-appearing or toxic-appearing.  HENT:     Head: Normocephalic and atraumatic.  Eyes:     General: No  scleral icterus. Cardiovascular:     Rate and Rhythm: Normal rate and regular rhythm.     Pulses: Normal pulses.          Radial pulses are 2+ on the right side and 2+ on the left side.     Heart sounds: Normal heart sounds. No murmur heard. Pulmonary:     Effort: Pulmonary effort is normal. No respiratory distress.     Breath sounds: Normal breath sounds.  Musculoskeletal:        General: No swelling, tenderness, deformity or signs of injury. Normal range of motion.     Cervical back: Normal range of motion and neck supple. No tenderness.     Comments: Strength 5/5 in bilateral upper extremities.  Skin:    General: Skin is warm and dry.     Capillary Refill: Capillary refill takes less than 2 seconds.     Findings: No erythema or rash.  Neurological:     General: No focal deficit present.     Mental Status: He is alert and oriented to person, place, and time. Mental status is at baseline.     Sensory: Sensation is intact.     Motor: Motor function is intact. No weakness.  Psychiatric:  Mood and Affect: Mood normal.        Behavior: Behavior normal.        Thought Content: Thought content normal.        Judgment: Judgment normal.    ED Results / Procedures / Treatments   Labs (all labs ordered are listed, but only abnormal results are displayed) Labs Reviewed  BASIC METABOLIC PANEL - Abnormal; Notable for the following components:      Result Value   Glucose, Bld 111 (*)    All other components within normal limits  CBC WITH DIFFERENTIAL/PLATELET  TROPONIN I (HIGH SENSITIVITY)  TROPONIN I (HIGH SENSITIVITY)    EKG EKG Interpretation  Date/Time:  Thursday April 21 2021 08:03:26 EST Ventricular Rate:  62 PR Interval:  175 QRS Duration: 89 QT Interval:  411 QTC Calculation: 418 R Axis:   14 Text Interpretation: Sinus rhythm Abnormal R-wave progression, early transition Confirmed by Dene Gentry [phone]) on 04/21/2021 8:16:01 AM  Radiology DG Chest 2  View  Result Date: 04/21/2021 CLINICAL DATA:  Pain, history of cardiac catheterization EXAM: CHEST - 2 VIEW COMPARISON:  Radiograph 05/01/2013 FINDINGS: The cardiomediastinal silhouette is within normal limits. There is no focal airspace consolidation. There is no large pleural effusion. There is no visible pneumothorax. No acute osseous abnormality. Prior cerclage wire fixation of the right distal clavicle. IMPRESSION: No evidence of acute cardiopulmonary disease. Electronically Signed   By: Maurine Simmering M.D.   On: 04/21/2021 08:57    Procedures Procedures    Medications Ordered in ED Medications - No data to display  ED Course/ Medical Decision Making/ A&P                           Medical Decision Making This patient presents to the ED for concern of left arm pain, this involves an extensive number of treatment options, and is a complaint that carries with it a low to moderate risk of complications and morbidity.  The differential diagnosis includes musculoskeletal pain, ACS to include STEMI and NSTEMI, DVT   Co morbidities that complicate the patient evaluation  History of prostate cancer   Additional history obtained:  Additional history obtained from wife External records from outside source obtained and reviewed including primary care visits   Lab Tests:  I Ordered, and personally interpreted labs.   The pertinent results include:   CBC within normal limits BMP within normal limits Troponin 3, negative   Imaging Studies ordered:  I ordered imaging studies including EKG, chest x-ray, vascular ultrasound DVT study left upper extremity I independently visualized and interpreted imaging which showed  EKG shows sinus rhythm Chest x-ray within normal limits Vascular ultrasound negative for DVT I agree with the radiologist interpretation   Cardiac Monitoring:  The patient was maintained on a cardiac monitor.  I personally viewed and interpreted the cardiac monitored  which showed an underlying rhythm of: Normal sinus rhythm  Test Considered:  No further testing required   Problem List / ED Course:  Left arm pain 60 year old male who presents emergency department left arm pain.  Patient describes it as a squeezing pain with no other associated symptoms to include chest pain, shortness of breath, swelling, numbness or tingling, weakness, back pain.  Given the symptoms we will obtain ultrasound DVT study as well as cardiac work-up.  Patient had ultrasound today which was negative for DVT.  Chest x-ray was negative as well.  Obtained EKG which has some normal  R wave progression but underlying rhythm of sinus rhythm.  Appears similar to previous.  Initial troponin negative.  Do not feel that patient needs second troponin at this time as he is not having active chest pain.  Unclear etiology of the patient's right arm pain.  This may be musculoskeletal in nature.  Overall his work-up has been reassuring here.  Presentation is not consistent with dissection, there is no neurological deficit to help support this, no chest pain or back pain.  Presentation is also not consistent with PE.  He is DVT negative.  Joints without swelling, erythema or warmth concerning for septic joint.  Range of motion within normal limits, there is no injury or trauma present. overall work-up has been reassuring. HEAR Score: 1   Dispostion  After consideration of the diagnostic results and the patients response to treatment, I feel that the patent would benefit from discharge.  Patient likely has musculoskeletal pain of the left arm.  Discussed results at the bedside with him and answered all of his questions as well as his wife's questions.  Advised that he should use ibuprofen as needed over the next few days as well as ice or heat and gentle range of motion.  Discussed that he should follow with primary care provider.  He also has questions about intermittent left lower leg swelling.   Discussed that he should follow-up with primary care provider when he is actively having swelling (currently not swollen here).  He verbalized understanding.  Patient does not meet criteria for admission at this time.  Safe for discharge.  Final Clinical Impression(s) / ED Diagnoses Final diagnoses:  Left arm pain    Rx / DC Orders ED Discharge Orders     None         Mickie Hillier, PA-C 04/21/21 6073    Valarie Merino, MD 04/21/21 1451

## 2021-04-21 NOTE — ED Triage Notes (Signed)
Pt reports with left upper arm pain that started yesterday morning. Pt states that the pain is more of a pressure feeling like something is squeezing his arm.

## 2021-04-21 NOTE — ED Provider Triage Note (Signed)
Emergency Medicine Provider Triage Evaluation Note  Adam Navarro , a 61 y.o. male  was evaluated in triage.  Pt complains of l arm pain.  Patient had some mild left arm pain yesterday.  It woke him from sleep today with worsened left arm pain.  Is mostly in the belly of his left biceps.  Pain is not worse with movement.  He also has some discomfort around his wrist, but not currently.  No recent fall, trauma, or injury.  Remote history of prostate cancer, otherwise no risk factors for DVT.  Review of Systems  Positive: L arm pain Negative: numbness  Physical Exam  BP (!) 165/94 (BP Location: Right Arm)    Pulse 73    Temp 97.9 F (36.6 C) (Oral)    Resp 18    Ht 5\' 9"  (1.753 m)    Wt 74.8 kg    SpO2 100%    BMI 24.37 kg/m  Gen:   Awake, no distress   Resp:  Normal effort  MSK:   Moves extremities without difficulty. Mild discomfort with palpation of the L biceps. No swelling, erythema, or warmth.    Medical Decision Making  Medically screening exam initiated at 5:01 AM.  Appropriate orders placed.  Adam Navarro was informed that the remainder of the evaluation will be completed by another provider, this initial triage assessment does not replace that evaluation, and the importance of remaining in the ED until their evaluation is complete.  [place] and labs   Franchot Heidelberg, PA-C 04/21/21 0503

## 2021-04-21 NOTE — Discharge Instructions (Addendum)
You were seen in the emergency department today for left arm pain.  While you were here we ensure that your heart looked okay.  Your lab work, chest x-ray and EKG were overall reassuring.  This may be musculoskeletal pain.  Please take ibuprofen as needed over the next few days as well as ice and heat and gentle range of motion for your arm.  Should you begin to have worsening arm pain, shortness of breath, chest pain or any of these associated with nausea or sweating please return to the emergency department to be evaluated.  Also discussed your intermittent swelling of your left leg.  Please ensure to follow-up with your primary care provider regarding this.

## 2022-01-27 ENCOUNTER — Encounter: Payer: 59 | Admitting: Family Medicine

## 2022-01-30 IMAGING — CR DG CHEST 2V
2 series · 2 of 2 positions shown · non-contrast
Comparison: Radiograph 05/01/2013

CLINICAL DATA: Pain, history of cardiac catheterization

EXAM:
CHEST - 2 VIEW

[w chest pa]
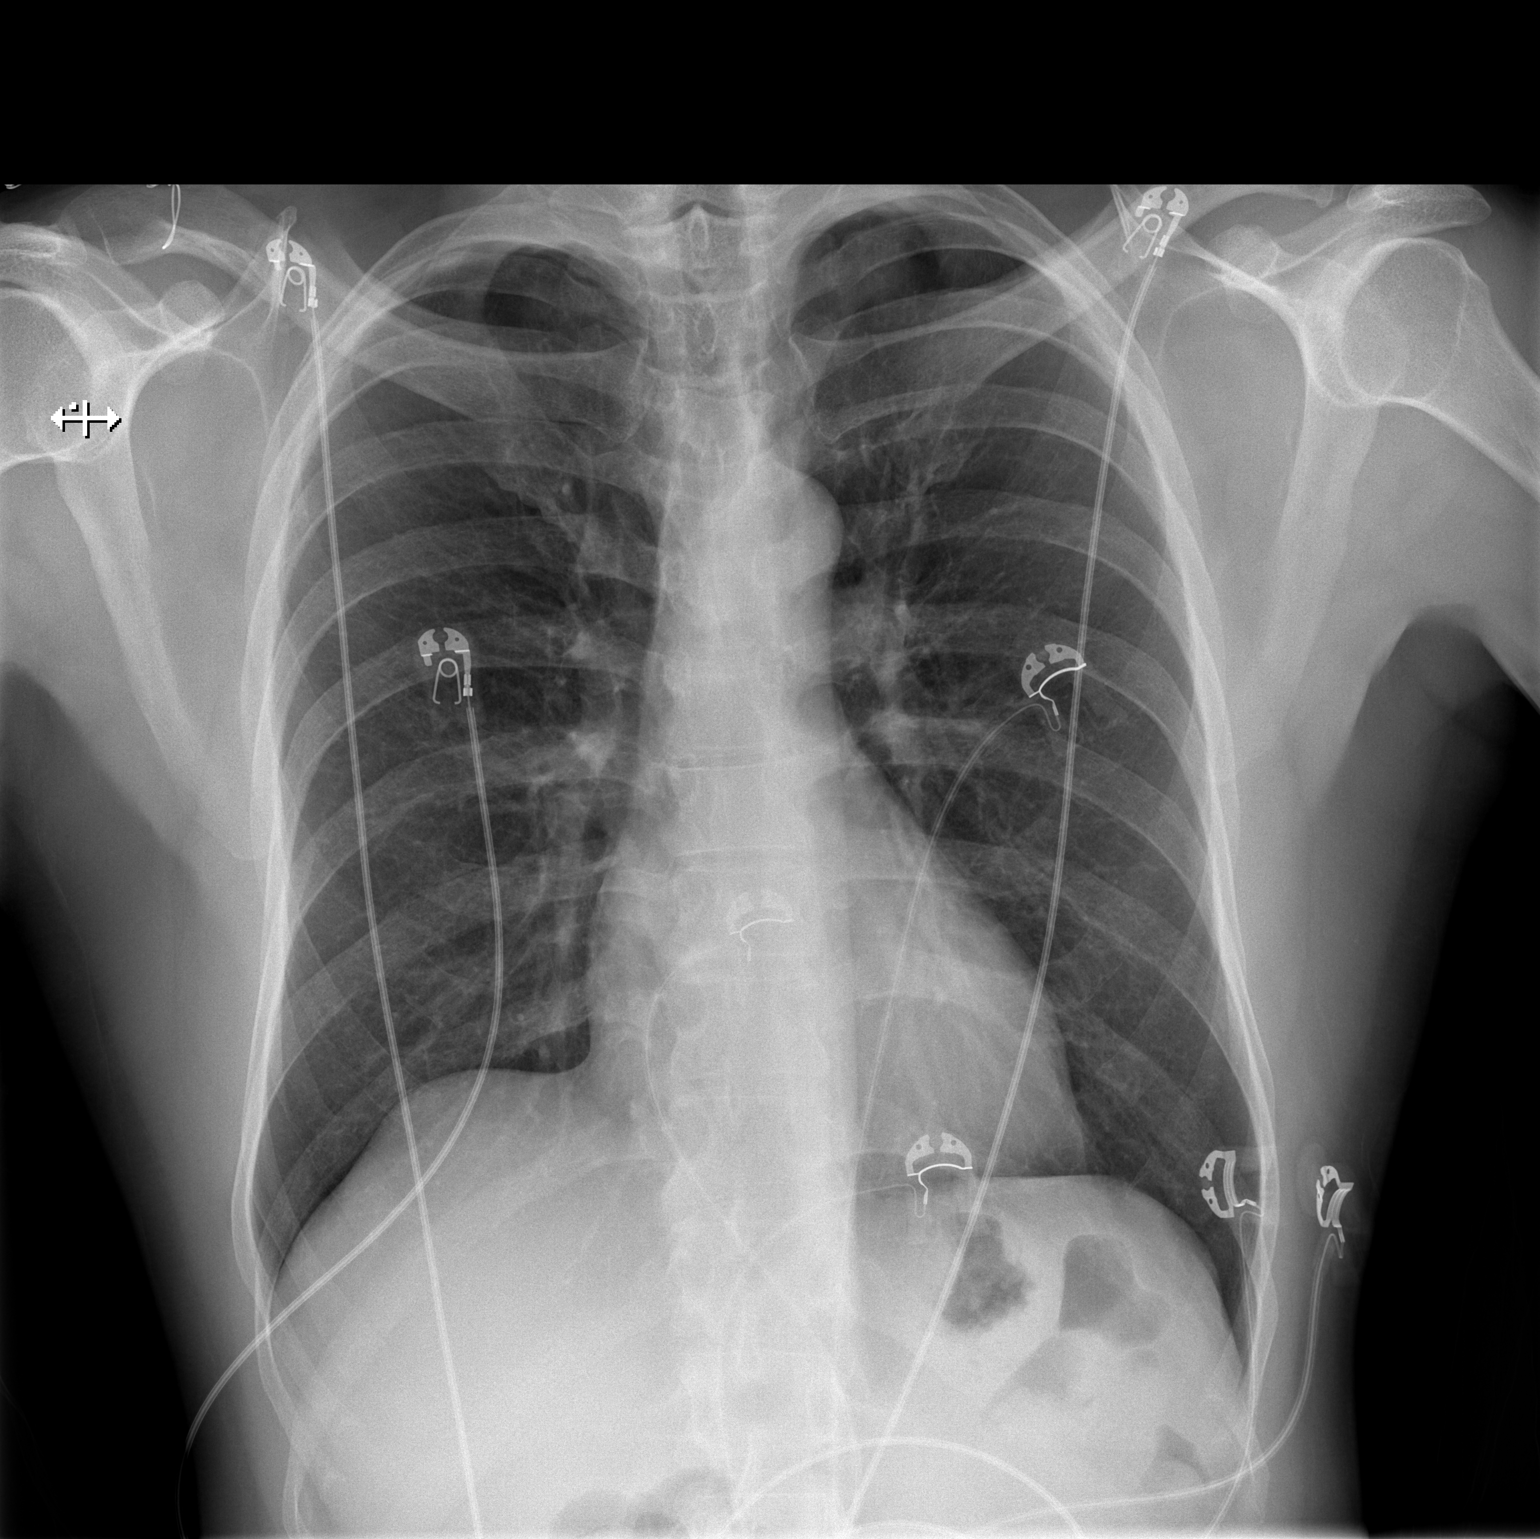

[w chest lat]
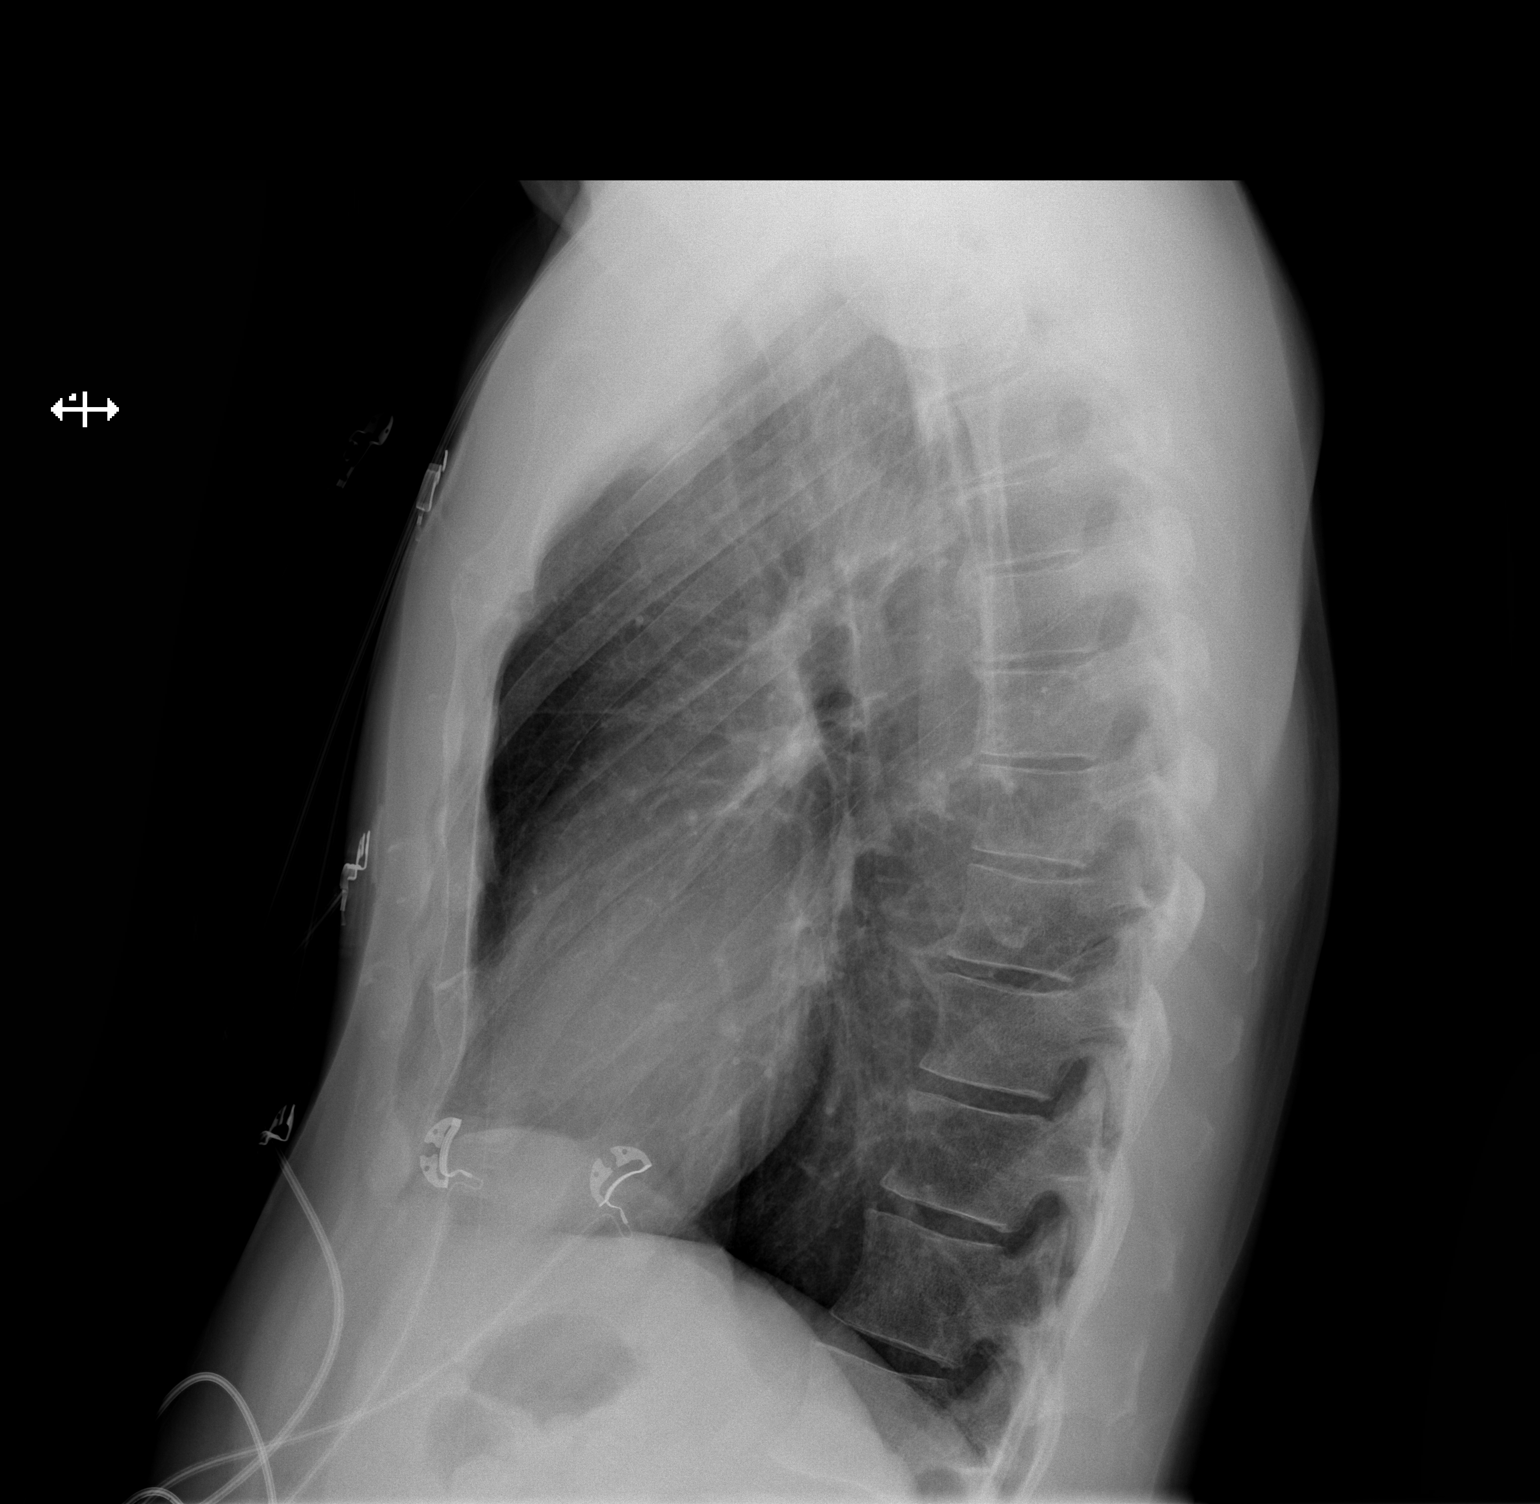

[2 of 2 positions shown; findings below may reference images not displayed]

FINDINGS: The cardiomediastinal silhouette is within normal limits. There is
no focal airspace consolidation. There is no large pleural effusion.
There is no visible pneumothorax. No acute osseous abnormality.
Prior cerclage wire fixation of the right distal clavicle.
IMPRESSION: No evidence of acute cardiopulmonary disease.

## 2022-02-06 ENCOUNTER — Ambulatory Visit (INDEPENDENT_AMBULATORY_CARE_PROVIDER_SITE_OTHER): Payer: 59 | Admitting: Family Medicine

## 2022-02-06 ENCOUNTER — Encounter: Payer: Self-pay | Admitting: Family Medicine

## 2022-02-06 VITALS — BP 134/86 | HR 86 | Temp 97.7°F | Ht 69.0 in | Wt 173.6 lb

## 2022-02-06 DIAGNOSIS — N5231 Erectile dysfunction following radical prostatectomy: Secondary | ICD-10-CM

## 2022-02-06 DIAGNOSIS — Z0000 Encounter for general adult medical examination without abnormal findings: Secondary | ICD-10-CM | POA: Diagnosis not present

## 2022-02-06 DIAGNOSIS — R7301 Impaired fasting glucose: Secondary | ICD-10-CM

## 2022-02-06 DIAGNOSIS — Z8546 Personal history of malignant neoplasm of prostate: Secondary | ICD-10-CM | POA: Diagnosis not present

## 2022-02-06 DIAGNOSIS — H21561 Pupillary abnormality, right eye: Secondary | ICD-10-CM | POA: Insufficient documentation

## 2022-02-06 DIAGNOSIS — Z23 Encounter for immunization: Secondary | ICD-10-CM | POA: Diagnosis not present

## 2022-02-06 DIAGNOSIS — R600 Localized edema: Secondary | ICD-10-CM

## 2022-02-06 NOTE — Progress Notes (Signed)
[facility] [address] [place] [address] Dept: [phone] Dept Fax: [phone]  Annual Physical Visit  Subjective:    Patient ID: Adam Navarro, male    DOB: 1960/08/18, 61 y.o..   MRN: [id number]  Chief Complaint  Patient presents with   Annual Exam    CPE/labs.  Not fasting today.  C/o having a little swelling in LT leg.     History of Present Illness:  Patient is in today for an annual physical/preventative visit.  Mr. Armijo has a history of prediabetes.    Mr. Memmott has a history of prostate cancer and is s/p radical prostatectomy. He is followed by Dr. Alinda Money. His PSA tests have been very low. He does have some resulting ED, managed on 5 mg of Cialis daily as needed.    Mr. Puthoff has a history of mild swelling to his left lower leg. This is apparently due to the previous prostatectomy and lymph node biopsy. This is not bothersome to him.  Review of Systems  Constitutional:  Negative for chills, diaphoresis, fever, malaise/fatigue and weight loss.  HENT:  Negative for congestion, ear discharge, ear pain, hearing loss, sinus pain, sore throat and tinnitus.   Eyes:  Negative for blurred vision, pain, discharge and redness.       Notes vision has not been as clear since his last eye exam. He is not aware of any issue with an irregular pupil.  Respiratory:  Negative for cough, shortness of breath and wheezing.   Cardiovascular:  Positive for leg swelling. Negative for chest pain and palpitations.       Chronic mild left lower leg edema.  Gastrointestinal:  Positive for constipation. Negative for abdominal pain, diarrhea, heartburn, nausea and vomiting.       Mild constipation. Has bowel movements about every other day.  Genitourinary:        Mild dribbling after urination. Improves when he does his Kegel exercises.  Musculoskeletal:  Negative for back pain, joint pain and myalgias.  Skin:  Negative for  itching and rash.   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 and a PSA of 2.65 - Favorable Risk Stage I 09/19/2011   Past Surgical History:  Procedure Laterality Date   HERNIA REPAIR  06/2020   LYMPHADENECTOMY Bilateral 05/14/2017   Procedure: LYMPHADENECTOMY/ PELVIC;  Surgeon: Raynelle Bring, MD;  Location: [facility];  Service: Urology;  Laterality: Bilateral;   ORIF CLAVICLE FRACTURE Right 1973   R clavicular fracture   PROSTATE BIOPSY     ROBOT ASSISTED LAPAROSCOPIC RADICAL PROSTATECTOMY N/A 05/14/2017   Procedure: XI ROBOTIC ASSISTED LAPAROSCOPIC RADICAL PROSTATECTOMY LEVEL 2;  Surgeon: Raynelle Bring, MD;  Location: [facility];  Service: Urology;  Laterality: N/A;   Family History  Problem Relation Age of Onset   Diabetes Mother    Hypertension Mother    Kidney disease Father    Diabetes Father    Hypertension Father    Cancer Paternal Uncle        Throat   Colon cancer Neg Hx    Esophageal cancer Neg Hx    Stomach cancer Neg Hx    Rectal cancer Neg Hx    Outpatient Medications Prior  to Visit  Medication Sig Dispense Refill   tadalafil (CIALIS) 10 MG tablet Take 1 tablet (10 mg total) by mouth daily as needed for erectile dysfunction. 90 tablet 1   No facility-administered medications prior to visit.   Allergies  Allergen Reactions   Ciprofloxacin Hives     Objective:   Today's Vitals   02/06/22 1420  BP: 134/86  Pulse: 86  Temp: 97.7 F (36.5 C)  TempSrc: Temporal  SpO2: 99%  Weight: 173 lb 9.6 oz (78.7 kg)  Height: '5\' 9"'$  (1.753 m)   Body mass index is 25.64 kg/m.   General: Well developed, well nourished. No acute distress. HEENT: Normocephalic, non-traumatic. The right pupil is of  irregular shape with a possible cataract in   that eye as well. EOMI. Conjunctiva clear. External ears normal. EAC and TMs normal bilaterally.   Nose clear without congestion or rhinorrhea. Mucous membranes moist. Oropharynx clear. Good   dentition. Neck: Supple. No lymphadenopathy. No thyromegaly. Lungs: Clear to auscultation bilaterally. No wheezing, rales or rhonchi. CV: RRR without murmurs or rubs. Pulses 2+ bilaterally. Abdomen: Soft, non-tender. Bowel sounds positive, normal pitch and frequency. No   hepatosplenomegaly. No rebound or guarding. Extremities: Full ROM. No joint swelling or tenderness. Trace edema of left lower leg. Skin: Warm and dry. No rashes. Psych: Alert and oriented. Normal mood and affect.  Health Maintenance Due  Topic Date Due   HIV Screening  Never done   Zoster Vaccines- Shingrix (1 of 2) Never done   INFLUENZA VACCINE  11/08/2021     Assessment & Plan:   1. Annual physical exam Overall good health. We reviewed indicated screenings and immunizations. He plans to check on his insurance coverage for shingles vaccination.  2. History of prostate cancer Follows with Dr. Alinda Money. PSA has remained low.  3. Leg edema, left Stable.  4. Erectile dysfunction after radical prostatectomy Continue Cialis 10 mg daily as needed.  5. Impaired fasting glucose Will follow up on A1c and blood sugar.  - Hemoglobin I6E - Basic metabolic panel  6. Need for influenza vaccination  - Flu Vaccine QUAD 6+ mos PF IM (Fluarix Quad PF)  Return in about 1 year (around 02/07/2023) for Reassessment.   Haydee Salter, MD

## 2022-02-07 LAB — HEMOGLOBIN A1C: Hgb A1c MFr Bld: 6.4 % (ref 4.6–6.5)

## 2022-02-07 LAB — BASIC METABOLIC PANEL
BUN: 14 mg/dL (ref 6–23)
CO2: 29 mEq/L (ref 19–32)
Calcium: 10.1 mg/dL (ref 8.4–10.5)
Chloride: 102 mEq/L (ref 96–112)
Creatinine, Ser: 1 mg/dL (ref 0.40–1.50)
GFR: 82.05 mL/min (ref >60.00)
Glucose, Bld: 98 mg/dL (ref 70–99)
Potassium: 4.8 mEq/L (ref 3.5–5.1)
Sodium: 140 mEq/L (ref 135–145)

## 2022-03-22 ENCOUNTER — Encounter: Payer: Self-pay | Admitting: Family Medicine

## 2023-02-15 ENCOUNTER — Encounter: Payer: Self-pay | Admitting: Internal Medicine
